# Patient Record
Sex: Female | Born: 1970
Health system: Southern US, Community
[De-identification: ages and names within clinical notes are randomized; demographics above are authoritative.]

## PROBLEM LIST (undated history)

## (undated) DIAGNOSIS — D649 Anemia, unspecified: Secondary | ICD-10-CM

## (undated) DIAGNOSIS — J45909 Unspecified asthma, uncomplicated: Secondary | ICD-10-CM

## (undated) DIAGNOSIS — T7840XA Allergy, unspecified, initial encounter: Secondary | ICD-10-CM

## (undated) DIAGNOSIS — K219 Gastro-esophageal reflux disease without esophagitis: Secondary | ICD-10-CM

## (undated) DIAGNOSIS — G43909 Migraine, unspecified, not intractable, without status migrainosus: Secondary | ICD-10-CM

## (undated) HISTORY — DX: Anemia, unspecified: D64.9

## (undated) HISTORY — DX: Unspecified asthma, uncomplicated: J45.909

## (undated) HISTORY — DX: Allergy, unspecified, initial encounter: T78.40XA

## (undated) HISTORY — PX: OTHER SURGICAL HISTORY: SHX169

---

## 2000-04-23 ENCOUNTER — Other Ambulatory Visit: Admission: RE | Admit: 2000-04-23 | Discharge: 2000-04-23 | Payer: Self-pay | Admitting: Gynecology

## 2000-09-17 ENCOUNTER — Inpatient Hospital Stay (HOSPITAL_COMMUNITY): Admission: AD | Admit: 2000-09-17 | Discharge: 2000-09-20 | Payer: Self-pay | Admitting: Gynecology

## 2000-10-30 ENCOUNTER — Other Ambulatory Visit: Admission: RE | Admit: 2000-10-30 | Discharge: 2000-10-30 | Payer: Self-pay | Admitting: Gynecology

## 2011-02-09 ENCOUNTER — Other Ambulatory Visit (HOSPITAL_COMMUNITY)
Admission: RE | Admit: 2011-02-09 | Discharge: 2011-02-09 | Disposition: A | Discharge status: Home / Self Care | Payer: Managed Care, Other (non HMO) | Source: Ambulatory Visit | Attending: Family Medicine | Admitting: Family Medicine

## 2011-02-09 ENCOUNTER — Other Ambulatory Visit: Payer: Self-pay | Admitting: Family Medicine

## 2011-02-09 DIAGNOSIS — Z124 Encounter for screening for malignant neoplasm of cervix: Secondary | ICD-10-CM | POA: Insufficient documentation

## 2011-03-14 ENCOUNTER — Other Ambulatory Visit: Payer: Self-pay | Admitting: Orthopedic Surgery

## 2011-03-14 DIAGNOSIS — R52 Pain, unspecified: Secondary | ICD-10-CM

## 2011-03-20 ENCOUNTER — Other Ambulatory Visit: Payer: Managed Care, Other (non HMO)

## 2011-03-21 ENCOUNTER — Ambulatory Visit
Admission: RE | Admit: 2011-03-21 | Discharge: 2011-03-21 | Disposition: A | Discharge status: Home / Self Care | Payer: Managed Care, Other (non HMO) | Source: Ambulatory Visit | Attending: Orthopedic Surgery | Admitting: Orthopedic Surgery

## 2011-03-21 DIAGNOSIS — R52 Pain, unspecified: Secondary | ICD-10-CM

## 2011-06-01 ENCOUNTER — Ambulatory Visit: Payer: Self-pay | Attending: Orthopedic Surgery | Admitting: Rehabilitation

## 2011-06-01 DIAGNOSIS — R262 Difficulty in walking, not elsewhere classified: Secondary | ICD-10-CM | POA: Insufficient documentation

## 2011-06-01 DIAGNOSIS — M2560 Stiffness of unspecified joint, not elsewhere classified: Secondary | ICD-10-CM | POA: Insufficient documentation

## 2011-06-01 DIAGNOSIS — M2550 Pain in unspecified joint: Secondary | ICD-10-CM | POA: Insufficient documentation

## 2011-06-01 DIAGNOSIS — IMO0001 Reserved for inherently not codable concepts without codable children: Secondary | ICD-10-CM | POA: Insufficient documentation

## 2011-06-06 ENCOUNTER — Ambulatory Visit: Payer: Self-pay | Admitting: Physical Therapy

## 2011-06-08 ENCOUNTER — Ambulatory Visit: Payer: Self-pay | Admitting: Physical Therapy

## 2011-06-14 ENCOUNTER — Ambulatory Visit: Payer: Self-pay | Admitting: Rehabilitation

## 2011-06-16 ENCOUNTER — Ambulatory Visit: Payer: Self-pay | Admitting: Physical Therapy

## 2011-06-19 ENCOUNTER — Ambulatory Visit: Payer: Self-pay | Admitting: Physical Therapy

## 2011-06-20 ENCOUNTER — Ambulatory Visit: Payer: Self-pay | Admitting: Physical Therapy

## 2011-06-22 ENCOUNTER — Ambulatory Visit: Payer: Self-pay | Attending: Orthopedic Surgery | Admitting: Physical Therapy

## 2011-06-22 DIAGNOSIS — M2550 Pain in unspecified joint: Secondary | ICD-10-CM | POA: Insufficient documentation

## 2011-06-22 DIAGNOSIS — M2560 Stiffness of unspecified joint, not elsewhere classified: Secondary | ICD-10-CM | POA: Insufficient documentation

## 2011-06-22 DIAGNOSIS — IMO0001 Reserved for inherently not codable concepts without codable children: Secondary | ICD-10-CM | POA: Insufficient documentation

## 2011-06-22 DIAGNOSIS — R262 Difficulty in walking, not elsewhere classified: Secondary | ICD-10-CM | POA: Insufficient documentation

## 2011-06-27 ENCOUNTER — Encounter: Payer: Managed Care, Other (non HMO) | Admitting: Physical Therapy

## 2011-06-27 ENCOUNTER — Ambulatory Visit: Payer: Self-pay | Admitting: Physical Therapy

## 2011-06-29 ENCOUNTER — Ambulatory Visit: Payer: Self-pay | Admitting: Physical Therapy

## 2011-07-06 ENCOUNTER — Ambulatory Visit: Payer: Self-pay | Admitting: Physical Therapy

## 2011-07-07 ENCOUNTER — Ambulatory Visit: Payer: Self-pay | Admitting: Physical Therapy

## 2011-07-11 ENCOUNTER — Encounter: Payer: Managed Care, Other (non HMO) | Admitting: Physical Therapy

## 2011-07-13 ENCOUNTER — Ambulatory Visit: Payer: Self-pay | Admitting: Physical Therapy

## 2011-07-20 ENCOUNTER — Encounter: Payer: Managed Care, Other (non HMO) | Admitting: Physical Therapy

## 2011-08-03 ENCOUNTER — Encounter: Payer: Managed Care, Other (non HMO) | Admitting: Physical Therapy

## 2012-08-19 ENCOUNTER — Other Ambulatory Visit: Payer: Self-pay | Admitting: Family Medicine

## 2012-08-19 DIAGNOSIS — R109 Unspecified abdominal pain: Secondary | ICD-10-CM

## 2012-08-20 ENCOUNTER — Ambulatory Visit
Admission: RE | Admit: 2012-08-20 | Discharge: 2012-08-20 | Disposition: A | Discharge status: Home / Self Care | Payer: 59 | Source: Ambulatory Visit | Attending: Family Medicine | Admitting: Family Medicine

## 2012-08-20 DIAGNOSIS — R109 Unspecified abdominal pain: Secondary | ICD-10-CM

## 2013-10-04 ENCOUNTER — Encounter (HOSPITAL_BASED_OUTPATIENT_CLINIC_OR_DEPARTMENT_OTHER): Payer: Self-pay | Admitting: Emergency Medicine

## 2013-10-04 ENCOUNTER — Emergency Department (HOSPITAL_BASED_OUTPATIENT_CLINIC_OR_DEPARTMENT_OTHER): Payer: 59

## 2013-10-04 ENCOUNTER — Emergency Department (HOSPITAL_BASED_OUTPATIENT_CLINIC_OR_DEPARTMENT_OTHER)
Admission: EM | Admit: 2013-10-04 | Discharge: 2013-10-04 | Disposition: A | Discharge status: Home / Self Care | Payer: 59 | Attending: Emergency Medicine | Admitting: Emergency Medicine

## 2013-10-04 DIAGNOSIS — Z880 Allergy status to penicillin: Secondary | ICD-10-CM | POA: Insufficient documentation

## 2013-10-04 DIAGNOSIS — K219 Gastro-esophageal reflux disease without esophagitis: Secondary | ICD-10-CM | POA: Insufficient documentation

## 2013-10-04 DIAGNOSIS — K2970 Gastritis, unspecified, without bleeding: Secondary | ICD-10-CM

## 2013-10-04 DIAGNOSIS — K5900 Constipation, unspecified: Secondary | ICD-10-CM | POA: Insufficient documentation

## 2013-10-04 DIAGNOSIS — Z3202 Encounter for pregnancy test, result negative: Secondary | ICD-10-CM | POA: Insufficient documentation

## 2013-10-04 HISTORY — DX: Gastro-esophageal reflux disease without esophagitis: K21.9

## 2013-10-04 LAB — COMPREHENSIVE METABOLIC PANEL
AST: 20 U/L (ref 0–37)
Albumin: 3.7 g/dL (ref 3.5–5.2)
BUN: 12 mg/dL (ref 6–23)
CO2: 25 mEq/L (ref 19–32)
Calcium: 9 mg/dL (ref 8.4–10.5)
Chloride: 101 mEq/L (ref 96–112)
Creatinine, Ser: 0.5 mg/dL (ref 0.50–1.10)
GFR calc Af Amer: >90 mL/min (ref >90)
GFR calc non Af Amer: >90 mL/min (ref >90)
Glucose, Bld: 91 mg/dL (ref 70–99)
Total Bilirubin: 0.5 mg/dL (ref 0.3–1.2)

## 2013-10-04 LAB — URINALYSIS, ROUTINE W REFLEX MICROSCOPIC
Ketones, ur: NEGATIVE mg/dL
Nitrite: NEGATIVE
Specific Gravity, Urine: 1.024 (ref 1.005–1.030)
pH: 6 (ref 5.0–8.0)

## 2013-10-04 LAB — URINE MICROSCOPIC-ADD ON

## 2013-10-04 LAB — PREGNANCY, URINE: Preg Test, Ur: NEGATIVE

## 2013-10-04 LAB — LIPASE, BLOOD: Lipase: 33 U/L (ref 11–59)

## 2013-10-04 MED ORDER — IOHEXOL 300 MG/ML  SOLN
100.0000 mL | Freq: Once | INTRAMUSCULAR | Status: AC | PRN
  Administered 2013-10-04: 100 mL via INTRAVENOUS

## 2013-10-04 MED ORDER — ONDANSETRON HCL 4 MG/2ML IJ SOLN
4.0000 mg | Freq: Once | INTRAMUSCULAR | Status: AC
  Administered 2013-10-04: 4 mg via INTRAVENOUS
  Filled 2013-10-04: qty 2

## 2013-10-04 MED ORDER — HYDROMORPHONE HCL PF 1 MG/ML IJ SOLN
1.0000 mg | Freq: Once | INTRAMUSCULAR | Status: AC
  Administered 2013-10-04: 1 mg via INTRAVENOUS
  Filled 2013-10-04: qty 1

## 2013-10-04 MED ORDER — IOHEXOL 300 MG/ML  SOLN
50.0000 mL | Freq: Once | INTRAMUSCULAR | Status: AC | PRN
  Administered 2013-10-04: 50 mL via ORAL

## 2013-10-04 MED ORDER — OMEPRAZOLE 20 MG PO CPDR: 20.0000 mg | DELAYED_RELEASE_CAPSULE | Freq: Two times a day (BID) | ORAL | Status: DC

## 2013-10-04 MED ORDER — SODIUM CHLORIDE 0.9 % IV BOLUS (SEPSIS)
1000.0000 mL | Freq: Once | INTRAVENOUS | Status: AC
  Administered 2013-10-04: 1000 mL via INTRAVENOUS

## 2013-10-04 MED ORDER — PANTOPRAZOLE SODIUM 40 MG IV SOLR
40.0000 mg | Freq: Once | INTRAVENOUS | Status: AC
  Administered 2013-10-04: 40 mg via INTRAVENOUS
  Filled 2013-10-04: qty 40

## 2013-10-04 NOTE — ED Notes (Signed)
Patient here with LUQ pain/epigastric pain x 2 week. Here from [facility] for possible CT. Nausea with some vomiting. Had elevated WBC and blood in urine at [facility]

## 2013-10-04 NOTE — ED Provider Notes (Signed)
CSN: [id number]     Arrival date & time 10/04/13  1348 History  This chart was scribed for Melanie Quarry, MD by Clydene Laming, ED Scribe. This patient was seen in room MH10/MH10 and the patient's care was started at 3:38 PM.   Chief Complaint  Patient presents with  . Abdominal Pain    HPI HPI Comments: Melanie Herandez is a 42 y.o. female who presents to the Emergency Department complaining of left upper quadrant abd/epigastric pain onset two weeks ago with associated nausea, vomiting, and constipation. Pt reports to the ED from [facility]. Pt has been experiencing intermittent abd pain for the past year and a half but has been worst the last two weeks. Since yesterday pt has experienced 3-4 episodes an hour of sharp, burning, dull pain rated as 7/10 currently. Pt denies radiation to other regions. Pt has vomited last night and this morning. Pt states that she has not felt like eating because it is causing her to feel extremely full and bloated. Her last meal was this morning before work. Pt had an ultrasound one year ago done by [employer] [facility] on [address]. Pt was taking omeprazole which caused acid reflux disease. Pt is not using any other medications. Pt is allergic to penicillin. Pt denies smoking or drinking. Pt reports having normal periods and her last cycle was two weeks ago. Pt is not on birth control. Pt denies uti or vaginal discharge.    Past Medical History  Diagnosis Date  . GERD (gastroesophageal reflux disease)    History reviewed. No pertinent past surgical history. No family history on file. History  Substance Use Topics  . Smoking status: Never Smoker   . Smokeless tobacco: Not on file  . Alcohol Use: Not on file   OB History   Grav Para Term Preterm Abortions TAB SAB Ect Mult Living                 Review of Systems  Constitutional: Negative for fever.  Gastrointestinal: Positive for nausea, vomiting, abdominal pain and constipation.  Genitourinary:  Negative for vaginal discharge.    Allergies  Penicillins  Home Medications   Current Outpatient Rx  Name  Route  Sig  Dispense  Refill  . omeprazole (PRILOSEC) 20 MG capsule   Oral   Take 20 mg by mouth daily.          Triage Vitals: Pulse:71  Temp(Src) 98.7 F (37.1 C) (Oral)  Resp 16  Ht 5\' 3"  (1.6 m)  Wt 157 lb (71.215 kg)  BMI 27.82 kg/m2  SpO2 100%  LMP 09/20/2013 Physical Exam  Nursing note and vitals reviewed. Constitutional: She is oriented to person, place, and time. She appears well-developed and well-nourished.  HENT:  Head: Normocephalic and atraumatic.  Right Ear: External ear normal.  Left Ear: External ear normal.  Nose: Nose normal.  Mouth/Throat: Oropharynx is clear and moist.  Eyes: Conjunctivae and EOM are normal. Pupils are equal, round, and reactive to light.  Neck: Normal range of motion. Neck supple. No JVD present. No tracheal deviation present. No thyromegaly present.  Cardiovascular: Normal rate, regular rhythm, normal heart sounds and intact distal pulses.   Pulmonary/Chest: Effort normal and breath sounds normal. No respiratory distress. She has no wheezes.  Abdominal: Soft. Bowel sounds are normal. She exhibits no mass. There is tenderness. There is no guarding.  Tenderness of the ruq and epigastric region   Musculoskeletal: Normal range of motion.  Lymphadenopathy:    She  has no cervical adenopathy.  Neurological: She is alert and oriented to person, place, and time. She has normal reflexes. No cranial nerve deficit or sensory deficit. Gait normal. GCS eye subscore is 4. GCS verbal subscore is 5. GCS motor subscore is 6.  Reflex Scores:      Bicep reflexes are 2+ on the right side and 2+ on the left side.      Patellar reflexes are 2+ on the right side and 2+ on the left side. Strength is 5/5 bilateral elbow flexor/extensors, wrist extension/flexion, intrinsic hand strength equal Bilateral hip flexion/extension 5/5, knee  flexion/extension 5/5, ankle 5/5 flexion extension    Skin: Skin is warm and dry.  Psychiatric: She has a normal mood and affect. Her behavior is normal. Judgment and thought content normal.    ED Course  Procedures (including critical care time) DIAGNOSTIC STUDIES: Oxygen Saturation is 100% on RA, normal by my interpretation.    COORDINATION OF CARE: 3:48 PM- Discussed treatment plan with pt at bedside. Pt verbalized understanding and agreement with plan.   Labs Review Labs Reviewed  URINALYSIS, ROUTINE W REFLEX MICROSCOPIC - Abnormal; Notable for the following:    APPearance CLOUDY (*)    Hgb urine dipstick MODERATE (*)    Leukocytes, UA TRACE (*)    All other components within normal limits  URINE MICROSCOPIC-ADD ON - Abnormal; Notable for the following:    Squamous Epithelial / LPF FEW (*)    Bacteria, UA FEW (*)    All other components within normal limits  PREGNANCY, URINE   Imaging Review No results found.  EKG Interpretation   None       MDM  No diagnosis found.  42 y.o,. Female with epigastric pain increased with po intake.  She has report of negative [place] one year ago.  Today ct with gastritis and possible gastric ulcer.   Discussed with Dr. Bosie Clos and plan ppi bid and she is to call for follow up on Monday.    Discussed with patient and voices understanding.      Melanie Quarry, MD 10/04/13 [phone]

## 2013-10-04 NOTE — ED Notes (Signed)
Patient transported to CT. Pt door closed.

## 2013-10-06 NOTE — ED Notes (Signed)
Chart review for MD office, referd to,[facility] for further information

## 2016-02-21 DIAGNOSIS — Z124 Encounter for screening for malignant neoplasm of cervix: Secondary | ICD-10-CM | POA: Insufficient documentation

## 2016-02-21 DIAGNOSIS — Z1151 Encounter for screening for human papillomavirus (HPV): Secondary | ICD-10-CM | POA: Insufficient documentation

## 2016-02-21 DIAGNOSIS — R102 Pelvic and perineal pain: Secondary | ICD-10-CM | POA: Insufficient documentation

## 2017-07-13 ENCOUNTER — Encounter (HOSPITAL_COMMUNITY): Payer: Self-pay | Admitting: *Deleted

## 2017-07-13 ENCOUNTER — Observation Stay (HOSPITAL_COMMUNITY)
Admission: EM | Admit: 2017-07-13 | Discharge: 2017-07-14 | Disposition: A | Discharge status: Home / Self Care | Payer: 59 | Attending: Internal Medicine | Admitting: Internal Medicine

## 2017-07-13 DIAGNOSIS — E860 Dehydration: Secondary | ICD-10-CM

## 2017-07-13 DIAGNOSIS — A419 Sepsis, unspecified organism: Principal | ICD-10-CM | POA: Insufficient documentation

## 2017-07-13 DIAGNOSIS — K219 Gastro-esophageal reflux disease without esophagitis: Secondary | ICD-10-CM | POA: Diagnosis not present

## 2017-07-13 DIAGNOSIS — R55 Syncope and collapse: Secondary | ICD-10-CM | POA: Diagnosis not present

## 2017-07-13 DIAGNOSIS — R6510 Systemic inflammatory response syndrome (SIRS) of non-infectious origin without acute organ dysfunction: Secondary | ICD-10-CM | POA: Diagnosis present

## 2017-07-13 DIAGNOSIS — Z880 Allergy status to penicillin: Secondary | ICD-10-CM | POA: Insufficient documentation

## 2017-07-13 DIAGNOSIS — Z79899 Other long term (current) drug therapy: Secondary | ICD-10-CM | POA: Insufficient documentation

## 2017-07-13 DIAGNOSIS — B349 Viral infection, unspecified: Secondary | ICD-10-CM

## 2017-07-13 DIAGNOSIS — D72829 Elevated white blood cell count, unspecified: Secondary | ICD-10-CM

## 2017-07-13 LAB — CBC WITH DIFFERENTIAL/PLATELET
BASOS ABS: 0 10*3/uL (ref 0.0–0.1)
BASOS PCT: 0 %
EOS ABS: 0.1 10*3/uL (ref 0.0–0.7)
Eosinophils Relative: 0 %
HEMATOCRIT: 41.9 % (ref 36.0–46.0)
HEMOGLOBIN: 14.1 g/dL (ref 12.0–15.0)
Lymphocytes Relative: 11 %
Lymphs Abs: 2.3 10*3/uL (ref 0.7–4.0)
MCH: 27.2 pg (ref 26.0–34.0)
MCHC: 33.7 g/dL (ref 30.0–36.0)
MCV: 80.9 fL (ref 78.0–100.0)
Monocytes Absolute: 0.7 10*3/uL (ref 0.1–1.0)
Monocytes Relative: 4 %
NEUTROS ABS: 18 10*3/uL — AB (ref 1.7–7.7)
NEUTROS PCT: 85 %
Platelets: 348 10*3/uL (ref 150–400)
RBC: 5.18 MIL/uL — ABNORMAL HIGH (ref 3.87–5.11)
RDW: 13.7 % (ref 11.5–15.5)
WBC: 21.2 10*3/uL — ABNORMAL HIGH (ref 4.0–10.5)

## 2017-07-13 LAB — URINALYSIS, ROUTINE W REFLEX MICROSCOPIC
Bilirubin Urine: NEGATIVE
Glucose, UA: NEGATIVE mg/dL
Ketones, ur: NEGATIVE mg/dL
Leukocytes, UA: NEGATIVE
Nitrite: NEGATIVE
Protein, ur: NEGATIVE mg/dL
Specific Gravity, Urine: 1.02 (ref 1.005–1.030)
pH: 6 (ref 5.0–8.0)

## 2017-07-13 LAB — CBC
HCT: 44.9 % (ref 36.0–46.0)
Hemoglobin: 15 g/dL (ref 12.0–15.0)
MCH: 27.3 pg (ref 26.0–34.0)
MCHC: 33.4 g/dL (ref 30.0–36.0)
MCV: 81.6 fL (ref 78.0–100.0)
PLATELETS: 365 10*3/uL (ref 150–400)
RBC: 5.5 MIL/uL — ABNORMAL HIGH (ref 3.87–5.11)
RDW: 13.8 % (ref 11.5–15.5)
WBC: 23.8 10*3/uL — ABNORMAL HIGH (ref 4.0–10.5)

## 2017-07-13 LAB — CBG MONITORING, ED: GLUCOSE-CAPILLARY: 138 mg/dL — AB (ref 65–99)

## 2017-07-13 LAB — BASIC METABOLIC PANEL
Anion gap: 8 (ref 5–15)
BUN: 12 mg/dL (ref 6–20)
CALCIUM: 7.3 mg/dL — AB (ref 8.9–10.3)
CHLORIDE: 111 mmol/L (ref 101–111)
CO2: 20 mmol/L — AB (ref 22–32)
CREATININE: 0.61 mg/dL (ref 0.44–1.00)
GFR calc Af Amer: >60 mL/min (ref >60)
GFR calc non Af Amer: >60 mL/min (ref >60)
Glucose, Bld: 129 mg/dL — ABNORMAL HIGH (ref 65–99)
Potassium: 3.4 mmol/L — ABNORMAL LOW (ref 3.5–5.1)
Sodium: 139 mmol/L (ref 135–145)

## 2017-07-13 LAB — RAPID STREP SCREEN (MED CTR MEBANE ONLY): STREPTOCOCCUS, GROUP A SCREEN (DIRECT): NEGATIVE

## 2017-07-13 LAB — I-STAT CG4 LACTIC ACID, ED: LACTIC ACID, VENOUS: 2.85 mmol/L — AB (ref 0.5–1.9)

## 2017-07-13 LAB — LACTIC ACID, PLASMA
LACTIC ACID, VENOUS: 1.2 mmol/L (ref 0.5–1.9)
LACTIC ACID, VENOUS: 1.7 mmol/L (ref 0.5–1.9)

## 2017-07-13 MED ORDER — SODIUM CHLORIDE 0.9 % IV BOLUS (SEPSIS)
2000.0000 mL | Freq: Once | INTRAVENOUS | Status: AC
  Administered 2017-07-13: 2000 mL via INTRAVENOUS

## 2017-07-13 MED ORDER — SODIUM CHLORIDE 0.9 % IV SOLN
INTRAVENOUS | Status: DC
  Administered 2017-07-13: 16:00:00 via INTRAVENOUS

## 2017-07-13 MED ORDER — ENOXAPARIN SODIUM 40 MG/0.4ML ~~LOC~~ SOLN: 40.0000 mg | SUBCUTANEOUS | Status: DC

## 2017-07-13 MED ORDER — CEFTRIAXONE SODIUM 1 G IJ SOLR
1.0000 g | INTRAMUSCULAR | Status: DC
  Administered 2017-07-13: 1 g via INTRAVENOUS
  Filled 2017-07-13 (×2): qty 10

## 2017-07-13 MED ORDER — POTASSIUM CHLORIDE IN NACL 20-0.9 MEQ/L-% IV SOLN
INTRAVENOUS | Status: DC
  Administered 2017-07-13 – 2017-07-14 (×2): via INTRAVENOUS
  Filled 2017-07-13 (×4): qty 1000

## 2017-07-13 NOTE — ED Provider Notes (Signed)
[facility] DEPT Provider Note   CSN: [id number] Arrival date & time: 07/13/17  1341     History   Chief Complaint Chief Complaint  Patient presents with  . Dizziness    HPI Melanie Frazier is a 46 y.o. female.  46 year old female presents with acute onset of become dizzy and lightheaded just prior to arrival. She also became diaphoretic. Denied any chest pain or palpitations. No recent illnesses. Denies any recent heavy periods or bloody stools. No new medications. Does have a history of anemia. Begin hyperventilating and started to have bilateral hand and foot pain. EMS called and patient found hypotensive given IV fluids and transported here.      Past Medical History:  Diagnosis Date  . GERD (gastroesophageal reflux disease)     There are no active problems to display for this patient.   History reviewed. No pertinent surgical history.  OB History    No data available       Home Medications    Prior to Admission medications   Medication Sig Start Date End Date Taking? Authorizing Provider  pseudoephedrine (SINUS & ALLERGY 12 HOUR) 120 MG 12 hr tablet Take 120 mg by mouth every 12 (twelve) hours as needed for congestion.   Yes [provider]  omeprazole (PRILOSEC) 20 MG capsule Take 1 capsule (20 mg total) by mouth 2 (two) times daily. Patient not taking: Reported on 07/13/2017 10/04/13   Pattricia Boss, MD    Family History No family history on file.  Social History Social History  Substance Use Topics  . Smoking status: Never Smoker  . Smokeless tobacco: Not on file  . Alcohol use Not on file     Allergies   Penicillins   Review of Systems Review of Systems  All other systems reviewed and are negative.    Physical Exam Updated Vital Signs BP (!) 91/44   Pulse 63   Temp 98.9 F (37.2 C) (Oral)   Resp 18   LMP 07/06/2017   SpO2 100%   Physical Exam  Constitutional: She is oriented to person, place, and time. She appears  well-developed and well-nourished.  Non-toxic appearance. No distress.  HENT:  Head: Normocephalic and atraumatic.  Eyes: Pupils are equal, round, and reactive to light. Conjunctivae, EOM and lids are normal.  Neck: Normal range of motion. Neck supple. No tracheal deviation present. No thyroid mass present.  Cardiovascular: Normal rate, regular rhythm and normal heart sounds.  Exam reveals no gallop.   No murmur heard. Pulmonary/Chest: Effort normal and breath sounds normal. No stridor. No respiratory distress. She has no decreased breath sounds. She has no wheezes. She has no rhonchi. She has no rales.  Abdominal: Soft. Normal appearance and bowel sounds are normal. She exhibits no distension. There is no tenderness. There is no rebound and no CVA tenderness.  Musculoskeletal: Normal range of motion. She exhibits no edema or tenderness.  Neurological: She is alert and oriented to person, place, and time. She has normal strength. No cranial nerve deficit or sensory deficit. GCS eye subscore is 4. GCS verbal subscore is 5. GCS motor subscore is 6.  Skin: Skin is warm and dry. No abrasion and no rash noted.  Psychiatric: She has a normal mood and affect. Her speech is normal and behavior is normal.  Nursing note and vitals reviewed.    ED Treatments / Results  Labs (all labs ordered are listed, but only abnormal results are displayed) Labs Reviewed  CBG MONITORING, ED - Abnormal;  Notable for the following:       Result Value   Glucose-Capillary 138 (*)    All other components within normal limits  BASIC METABOLIC PANEL  CBC  URINALYSIS, ROUTINE W REFLEX MICROSCOPIC    EKG  EKG Interpretation  Date/Time:  Friday July 13 2017 13:54:17 EDT Ventricular Rate:  69 PR Interval:    QRS Duration: 99 QT Interval:  424 QTC Calculation: 455 R Axis:   83 Text Interpretation:  Sinus rhythm Borderline ST elevation, anterolateral leads Confirmed by Lacretia Leigh ([id number]) on 07/13/2017 2:16:03  PM       Radiology No results found.  Procedures Procedures (including critical care time)  Medications Ordered in ED Medications  sodium chloride 0.9 % bolus 2,000 mL (not administered)  0.9 %  sodium chloride infusion (not administered)     Initial Impression / Assessment and Plan / ED Course  I have reviewed the triage vital signs and the nursing notes.  Pertinent labs & imaging results that were available during my care of the patient were reviewed by me and considered in my medical decision making (see chart for details).     Patient responded well to IV fluids here. Blood pressure has improved. Significant leukocytosis noted and does have elevated lactate. She is afebrile here. Will repeat CBC. Discussed with hospitalist will come and see  Final Clinical Impressions(s) / ED Diagnoses   Final diagnoses:  None    New Prescriptions New Prescriptions   No medications on file     Lacretia Leigh, MD 07/13/17 1555

## 2017-07-13 NOTE — ED Triage Notes (Signed)
Per EMS, pt from work complains of dizziness, diaphoresis since 12PM today. Pt's BP was 80/50 upon EMS arrival. Pt has hx of anemia. Pt also has bilateral hand pain/tingling. Pt denies fall or loss of consciousness.   CBG 133 BP ranged from 80/50-100/60

## 2017-07-13 NOTE — ED Notes (Signed)
Bed: MW41 Expected date:  Expected time:  Means of arrival:  Comments: Dizziness, hypotension

## 2017-07-13 NOTE — ED Notes (Signed)
ED TO INPATIENT HANDOFF REPORT  Name/Age/Gender Melanie Frazier 46 y.o. female  Code Status    Code Status Orders        Start     Ordered   07/13/17 1605  Full code  Continuous     07/13/17 1605    Code Status History    Date Active Date Inactive Code Status Order ID Comments User Context   This patient has a current code status but no historical code status.      Home/SNF/Other Home  Chief Complaint Dizziness  Level of Care/Admitting Diagnosis ED Disposition    ED Disposition Condition [facility] Area: [facility] [[id number]]  Level of Care: Med-Surg [16]  Diagnosis: Sepsis [facility]) [[id number]]  Admitting Physician: Georgette Shell [[id number]]  Attending Physician: Georgette Shell [[id number]]  Estimated length of stay: 3 - 4 days  Certification:: I certify this patient will need inpatient services for at least 2 midnights  PT Class (Do Not Modify): Inpatient [101]  PT Acc Code (Do Not Modify): Private [1]       Medical History Past Medical History:  Diagnosis Date  . GERD (gastroesophageal reflux disease)     Allergies Allergies  Allergen Reactions  . Penicillins Rash    Has patient had a PCN reaction causing immediate rash, facial/tongue/throat swelling, SOB or lightheadedness with hypotension: n/a Has patient had a PCN reaction causing severe rash involving mucus membranes or skin necrosis: n/a Has patient had a PCN reaction that required hospitalization: n/a Has patient had a PCN reaction occurring within the last 10 years: n/a If all of the above answers are "NO", then may proceed with Cephalosporin use     IV Location/Drains/Wounds Patient Lines/Drains/Airways Status   Active Line/Drains/Airways    Name:   Placement date:   Placement time:   Site:   Days:   Peripheral IV 07/13/17 Left Forearm  07/13/17    1401    Forearm    less than 1   Peripheral IV 07/13/17 Right Forearm  07/13/17    1615    Forearm    less  than 1          Labs/Imaging Results for orders placed or performed during the hospital encounter of 07/13/17 (from the past 48 hour(s))  Basic metabolic panel     Status: Abnormal   Collection Time: 07/13/17  1:57 PM  Result Value Ref Range   Sodium 139 135 - 145 mmol/L   Potassium 3.4 (L) 3.5 - 5.1 mmol/L   Chloride 111 101 - 111 mmol/L   CO2 20 (L) 22 - 32 mmol/L   Glucose, Bld 129 (H) 65 - 99 mg/dL   BUN 12 6 - 20 mg/dL   Creatinine, Ser 0.61 0.44 - 1.00 mg/dL   Calcium 7.3 (L) 8.9 - 10.3 mg/dL   GFR calc non Af Amer >60 >60 mL/min   GFR calc Af Amer >60 >60 mL/min    Comment: (NOTE) The eGFR has been calculated using the CKD EPI equation. This calculation has not been validated in all clinical situations. eGFR's persistently <60 mL/min signify possible Chronic Kidney Disease.    Anion gap 8 5 - 15  CBC     Status: Abnormal   Collection Time: 07/13/17  1:57 PM  Result Value Ref Range   WBC 23.8 (H) 4.0 - 10.5 K/uL   RBC 5.50 (H) 3.87 - 5.11 MIL/uL   Hemoglobin 15.0 12.0 - 15.0 g/dL  HCT 44.9 36.0 - 46.0 %   MCV 81.6 78.0 - 100.0 fL   MCH 27.3 26.0 - 34.0 pg   MCHC 33.4 30.0 - 36.0 g/dL   RDW 13.8 11.5 - 15.5 %   Platelets 365 150 - 400 K/uL  Urinalysis, Routine w reflex microscopic     Status: Abnormal   Collection Time: 07/13/17  1:57 PM  Result Value Ref Range   Color, Urine YELLOW YELLOW   APPearance HAZY (A) CLEAR   Specific Gravity, Urine 1.020 1.005 - 1.030   pH 6.0 5.0 - 8.0   Glucose, UA NEGATIVE NEGATIVE mg/dL   Hgb urine dipstick SMALL (A) NEGATIVE   Bilirubin Urine NEGATIVE NEGATIVE   Ketones, ur NEGATIVE NEGATIVE mg/dL   Protein, ur NEGATIVE NEGATIVE mg/dL   Nitrite NEGATIVE NEGATIVE   Leukocytes, UA NEGATIVE NEGATIVE   RBC / HPF 0-5 0 - 5 RBC/hpf   WBC, UA 6-30 0 - 5 WBC/hpf   Bacteria, UA RARE (A) NONE SEEN   Squamous Epithelial / LPF 0-5 (A) NONE SEEN   Mucus PRESENT    Hyaline Casts, UA PRESENT   CBG monitoring, ED     Status: Abnormal    Collection Time: 07/13/17  2:00 PM  Result Value Ref Range   Glucose-Capillary 138 (H) 65 - 99 mg/dL  I-Stat CG4 Lactic Acid, ED     Status: Abnormal   Collection Time: 07/13/17  3:43 PM  Result Value Ref Range   Lactic Acid, Venous 2.85 (HH) 0.5 - 1.9 mmol/L   Comment NOTIFIED PHYSICIAN   CBC with Differential/Platelet     Status: Abnormal   Collection Time: 07/13/17  4:41 PM  Result Value Ref Range   WBC 21.2 (H) 4.0 - 10.5 K/uL   RBC 5.18 (H) 3.87 - 5.11 MIL/uL   Hemoglobin 14.1 12.0 - 15.0 g/dL   HCT 41.9 36.0 - 46.0 %   MCV 80.9 78.0 - 100.0 fL   MCH 27.2 26.0 - 34.0 pg   MCHC 33.7 30.0 - 36.0 g/dL   RDW 13.7 11.5 - 15.5 %   Platelets 348 150 - 400 K/uL   Neutrophils Relative % 85 %   Neutro Abs 18.0 (H) 1.7 - 7.7 K/uL   Lymphocytes Relative 11 %   Lymphs Abs 2.3 0.7 - 4.0 K/uL   Monocytes Relative 4 %   Monocytes Absolute 0.7 0.1 - 1.0 K/uL   Eosinophils Relative 0 %   Eosinophils Absolute 0.1 0.0 - 0.7 K/uL   Basophils Relative 0 %   Basophils Absolute 0.0 0.0 - 0.1 K/uL   No results found.  Pending Labs [facility]     Ordered   07/20/17 0500  Creatinine, serum  (enoxaparin (LOVENOX)    CrCl >/= 30 ml/min)  Weekly,   R    Comments:  while on enoxaparin therapy    07/13/17 1605   07/14/17 5176  Basic metabolic panel  Tomorrow morning,   R     07/13/17 1605   07/14/17 0500  CBC  Tomorrow morning,   R     07/13/17 1605   07/13/17 1603  HIV antibody (Routine Testing)  Once,   R     07/13/17 1605   07/13/17 1603  CBC  (enoxaparin (LOVENOX)    CrCl >/= 30 ml/min)  Once,   R    Comments:  Baseline for enoxaparin therapy IF NOT ALREADY DRAWN.  Notify MD if PLT < 100 K.  07/13/17 1605   07/13/17 1547  Culture, blood (Routine X 2) w Reflex to ID Panel  BLOOD CULTURE X 2,   R     07/13/17 1546      Vitals/Pain Today's Vitals   07/13/17 1400 07/13/17 1500 07/13/17 1530 07/13/17 1600  BP: (!) 94/51 (!) 92/49 105/79 103/63  Pulse: 61 74 80 82   Resp: 18 17 14 15   Temp:      TempSrc:      SpO2: 100% 100% 100% 100%  PainSc:        Isolation Precautions No active isolations  Medications Medications  0.9 %  sodium chloride infusion ( Intravenous New Bag/Given 07/13/17 1629)  enoxaparin (LOVENOX) injection 40 mg (not administered)  sodium chloride 0.9 % bolus 2,000 mL (0 mLs Intravenous Stopped 07/13/17 1615)    Mobility walks

## 2017-07-13 NOTE — H&P (Addendum)
History and Physical    Melanie Frazier ZDG:[id number] DOB: October 25, 1971 DOA: 07/13/2017  PCP: Patient, No Pcp Per  Patient coming from: home    Chief Complaint: dizzy  HPI: Melanie Frazier is a 46 y.o. female admitted with dizziness.patient was at work.she works at [employer] in [employer] were its mainly hot.she denies fever,chills,nausea,vomitting,diarrhea.she denies chest pain shortness of breath.she denies headache,changes with vision. She denies shortness of breath swallowing difficulty,. She is actually hungry and requesting for food. She reports that she was sick a week ago but it lasted only 2 days. She took [employer] and her symptoms got better.  ED Course: received ivf.  Review of Systems: As per HPI otherwise 10 point review of systems negative.    Past Medical History:  Diagnosis Date  . GERD (gastroesophageal reflux disease)     History reviewed. No pertinent surgical history.   reports that she has never smoked. She does not have any smokeless tobacco history on file. Her alcohol and drug histories are not on file.  Allergies  Allergen Reactions  . Penicillins Rash    Has patient had a PCN reaction causing immediate rash, facial/tongue/throat swelling, SOB or lightheadedness with hypotension: n/a Has patient had a PCN reaction causing severe rash involving mucus membranes or skin necrosis: n/a Has patient had a PCN reaction that required hospitalization: n/a Has patient had a PCN reaction occurring within the last 10 years: n/a If all of the above answers are "NO", then may proceed with Cephalosporin use     No family history on file. Unacceptable: Noncontributory, unremarkable, or negative. Acceptable: Family history reviewed and not pertinent (If you reviewed it)  Prior to Admission medications   Medication Sig Start Date End Date Taking? Authorizing Provider  pseudoephedrine (SINUS & ALLERGY 12 HOUR) 120 MG 12 hr tablet Take 120 mg by mouth every 12  (twelve) hours as needed for congestion.   Yes [provider]  omeprazole (PRILOSEC) 20 MG capsule Take 1 capsule (20 mg total) by mouth 2 (two) times daily. Patient not taking: Reported on 07/13/2017 10/04/13   Pattricia Boss, MD    Physical Exam: Vitals:   07/13/17 1400 07/13/17 1500 07/13/17 1530 07/13/17 1600  BP: (!) 94/51 (!) 92/49 105/79 103/63  Pulse: 61 74 80 82  Resp: 18 17 14 15   Temp:      TempSrc:      SpO2: 100% 100% 100% 100%    Constitutional: NAD, calm, comfortable Vitals:   07/13/17 1400 07/13/17 1500 07/13/17 1530 07/13/17 1600  BP: (!) 94/51 (!) 92/49 105/79 103/63  Pulse: 61 74 80 82  Resp: 18 17 14 15   Temp:      TempSrc:      SpO2: 100% 100% 100% 100%   Eyes: PERRL, lids and conjunctivae normal ENMT: Mucous membranes are moist. Posterior pharynx mild erythema..Normal dentition.  Neck: normal, supple, no masses, no thyromegaly Respiratory: clear to auscultation bilaterally, no wheezing, no crackles. Normal respiratory effort. No accessory muscle use.  Cardiovascular: Regular rate and rhythm, no murmurs / rubs / gallops. No extremity edema. 2+ pedal pulses. No carotid bruits.  Abdomen: no tenderness, no masses palpated. No hepatosplenomegaly. Bowel sounds positive.  Musculoskeletal: no clubbing / cyanosis. No joint deformity upper and lower extremities. Good ROM, no contractures. Normal muscle tone.  Skin: no rashes, lesions, ulcers. No induration Neurologic: CN 2-12 grossly intact. Sensation intact, DTR normal. Strength 5/5 in all 4.  Psychiatric: Normal judgment and insight. Alert and oriented x  3. Normal  Labs on Admission: I have personally reviewed following labs and imaging studies  CBC:  Recent Labs Lab 07/13/17 1357 07/13/17 1641  WBC 23.8* 21.2*  NEUTROABS  --  18.0*  HGB 15.0 14.1  HCT 44.9 41.9  MCV 81.6 80.9  PLT 365 470   Basic Metabolic Panel:  Recent Labs Lab 07/13/17 1357  NA 139  K 3.4*  CL 111  CO2 20*    GLUCOSE 129*  BUN 12  CREATININE 0.61  CALCIUM 7.3*   GFR: CrCl cannot be calculated (Unknown ideal weight.). Liver Function Tests: No results for input(s): AST, ALT, ALKPHOS, BILITOT, PROT, ALBUMIN in the last 168 hours. No results for input(s): LIPASE, AMYLASE in the last 168 hours. No results for input(s): AMMONIA in the last 168 hours. Coagulation Profile: No results for input(s): INR, PROTIME in the last 168 hours. Cardiac Enzymes: No results for input(s): CKTOTAL, CKMB, CKMBINDEX, TROPONINI in the last 168 hours. BNP (last 3 results) No results for input(s): PROBNP in the last 8760 hours. HbA1C: No results for input(s): HGBA1C in the last 72 hours. CBG:  Recent Labs Lab 07/13/17 1400  GLUCAP 138*   Lipid Profile: No results for input(s): CHOL, HDL, LDLCALC, TRIG, CHOLHDL, LDLDIRECT in the last 72 hours. Thyroid Function Tests: No results for input(s): TSH, T4TOTAL, FREET4, T3FREE, THYROIDAB in the last 72 hours. Anemia Panel: No results for input(s): VITAMINB12, FOLATE, FERRITIN, TIBC, IRON, RETICCTPCT in the last 72 hours. Urine analysis:    Component Value Date/Time   COLORURINE YELLOW 07/13/2017 1357   APPEARANCEUR HAZY (A) 07/13/2017 1357   LABSPEC 1.020 07/13/2017 1357   PHURINE 6.0 07/13/2017 1357   GLUCOSEU NEGATIVE 07/13/2017 1357   HGBUR SMALL (A) 07/13/2017 1357   BILIRUBINUR NEGATIVE 07/13/2017 1357   [place] 07/13/2017 1357   PROTEINUR NEGATIVE 07/13/2017 1357   UROBILINOGEN 0.2 10/04/2013 1405   NITRITE NEGATIVE 07/13/2017 1357   LEUKOCYTESUR NEGATIVE 07/13/2017 1357    Radiological Exams on Admission: No results found.  EKG: Independently reviewed.   Assessment/Plan Active Problems:   Sepsis ([place]) Leukocytosis with no bandemia patient presented with symptoms of dizziness. The only complaint she had after I saw her was her throat hurts. She also reports that she was sick almost a week ago and she took Alka-Seltzer and her  symptoms got better. And she is allergic to penicillin. Blood cultures have been drawn in the ER will monitor and follow follow-up labs tomorrow morning.patient Started complaining of throat pain after she got to the floor.will start rocephin for possible pharyngitis.  Hypokalemia mild replete potassium.  Mild dehydration after IV hydration her white count came down to 21.2 as well as her hemoglobin and platelets came down a bit after hydration.     DVT prophylaxis:lovenox Code Status: full Family Communication:  Disposition Plan:  Consults called:  Admission status: inpatient   Georgette Shell MD [facility]   If 7PM-7AM, please contact night-coverage [email or web address] Password [facility]  07/13/2017, 5:40 PM

## 2017-07-13 NOTE — ED Notes (Signed)
Lying: HR76, RR17, SpO2 100%, BP 100/45 Sitting: HR88, RR18, SpO2 100%, BP 103/52 Standing: HR90, RR19, SpO2 100%, BP 100/55

## 2017-07-14 DIAGNOSIS — B349 Viral infection, unspecified: Secondary | ICD-10-CM

## 2017-07-14 DIAGNOSIS — R6510 Systemic inflammatory response syndrome (SIRS) of non-infectious origin without acute organ dysfunction: Secondary | ICD-10-CM

## 2017-07-14 DIAGNOSIS — D72829 Elevated white blood cell count, unspecified: Secondary | ICD-10-CM | POA: Diagnosis not present

## 2017-07-14 DIAGNOSIS — E860 Dehydration: Secondary | ICD-10-CM | POA: Diagnosis not present

## 2017-07-14 LAB — CBC
HCT: 37.3 % (ref 36.0–46.0)
HEMOGLOBIN: 12.3 g/dL (ref 12.0–15.0)
MCH: 27 pg (ref 26.0–34.0)
MCHC: 33 g/dL (ref 30.0–36.0)
MCV: 81.8 fL (ref 78.0–100.0)
Platelets: 337 10*3/uL (ref 150–400)
RBC: 4.56 MIL/uL (ref 3.87–5.11)
RDW: 14.1 % (ref 11.5–15.5)
WBC: 12.2 10*3/uL — ABNORMAL HIGH (ref 4.0–10.5)

## 2017-07-14 LAB — BASIC METABOLIC PANEL
ANION GAP: 4 — AB (ref 5–15)
BUN: 7 mg/dL (ref 6–20)
CALCIUM: 7.6 mg/dL — AB (ref 8.9–10.3)
CO2: 23 mmol/L (ref 22–32)
Chloride: 112 mmol/L — ABNORMAL HIGH (ref 101–111)
Creatinine, Ser: 0.51 mg/dL (ref 0.44–1.00)
GFR calc non Af Amer: >60 mL/min (ref >60)
Glucose, Bld: 95 mg/dL (ref 65–99)
POTASSIUM: 3.7 mmol/L (ref 3.5–5.1)
Sodium: 139 mmol/L (ref 135–145)

## 2017-07-14 LAB — HIV ANTIBODY (ROUTINE TESTING W REFLEX): HIV Screen 4th Generation wRfx: NONREACTIVE

## 2017-07-14 MED ORDER — DOXYCYCLINE HYCLATE 100 MG PO CAPS: 100.0000 mg | ORAL_CAPSULE | Freq: Two times a day (BID) | ORAL | 0 refills | Status: DC

## 2017-07-14 NOTE — Care Management Note (Signed)
Case Management Note  Patient Details  Name: Melanie Frazier MRN: [id number] Date of Birth: 27-Jul-1971  Subjective/Objective:                 Patient with order to DC to home today. Chart reviewed. No Home Health or Equipment needs, no unacknowledged Case Management consults or medication needs identified at the time of this note. Plan for DC to home. If needs arise today prior to discharge, please call Carles Collet RN CM at [phone].    Action/Plan:   Expected Discharge Date:  07/14/17               Expected Discharge Plan:  Home/Self Care  In-House Referral:     Discharge planning Services  CM Consult  Post Acute Care Choice:    Choice offered to:     DME Arranged:    DME Agency:     HH Arranged:    HH Agency:     Status of Service:  Completed, signed off  If discussed at [employer] of Stay Meetings, dates discussed:    Additional Comments:  Carles Collet, RN 07/14/2017, 8:51 AM

## 2017-07-14 NOTE — Progress Notes (Signed)
Pts IV removed with a clean and dry dressing intact. Pt denies pain at this time with no s/s of distress noted.

## 2017-07-14 NOTE — Discharge Summary (Signed)
Physician Discharge Summary  Melanie Frazier KVQ:[id number] DOB: 1971-07-18 DOA: 07/13/2017  PCP: Patient, No Pcp Per  Admit date: 07/13/2017 Discharge date: 07/14/2017  Admitted From: home Disposition:  home  Recommendations for Outpatient Follow-up:  1. Follow up with PCP as needed  Home Health: none Equipment/Devices: none  Discharge Condition: stable CODE STATUS: Full code Diet recommendation: regular  HPI: Per Dr. Rodena Piety, Melanie Frazier is a 46 y.o. female admitted with dizziness.patient was at work.she works at [employer] in [employer] were its mainly hot.she denies fever,chills,nausea,vomitting,diarrhea.she denies chest pain shortness of breath.she denies headache,changes with vision. She denies shortness of breath swallowing difficulty,. She is actually hungry and requesting for food. She reports that she was sick a week ago but it lasted only 2 days. She took [employer] and her symptoms got better.  Hospital Course: Discharge Diagnoses:  Active Problems:   SIRS (systemic inflammatory response syndrome) (HCC)   Leukocytosis   Viral illness   Dehydration  Patient was admitted to the hospital with an episode of dizziness while at work.  She was found to be hypotensive on arrival, had a leukocytosis of 23K as well as an elevated lactic acid of 2.8.  She was somewhat hemoconcentrated with a hemoglobin on the upper limit of normal at 15, suggesting a component of dehydration while at work in a hot environment.  She was afebrile.  She was complaining of a sore throat, and strep test was negative.  She was placed empirically on ceftriaxone out of concern for pharyngitis.  With IV fluids and supportive treatment, she improved, her dizziness resolved, and her white count has improved to 12.  She was feeling well, able to have adequate p.o. intake, her antibiotics were narrowed to doxycycline for 3 more days for presumed URI as she was describing nonspecific sinus pressure/symptoms  on discharge.  Telemetry was reviewed and it was unremarkable.  Her presentation is most likely due to viral illness/transient URI.  Blood cultures were obtained in the emergency room, they are negative at the time of discharge, patient was instructed that should blood cultures become positive she will receive a call to return to the hospital.  She expressed understanding.   Discharge Instructions   Allergies as of 07/14/2017      Reactions   Penicillins Rash   Has patient had a PCN reaction causing immediate rash, facial/tongue/throat swelling, SOB or lightheadedness with hypotension: n/a Has patient had a PCN reaction causing severe rash involving mucus membranes or skin necrosis: n/a Has patient had a PCN reaction that required hospitalization: n/a Has patient had a PCN reaction occurring within the last 10 years: n/a If all of the above answers are "NO", then may proceed with Cephalosporin use      Medication List    STOP taking these medications   omeprazole 20 MG capsule Commonly known as:  PRILOSEC     TAKE these medications   doxycycline 100 MG capsule Commonly known as:  VIBRAMYCIN Take 1 capsule (100 mg total) by mouth 2 (two) times daily.   SINUS & ALLERGY 12 HOUR 120 MG 12 hr tablet Generic drug:  pseudoephedrine Take 120 mg by mouth every 12 (twelve) hours as needed for congestion.            Discharge Care Instructions        Start     Ordered   07/14/17 0000  doxycycline (VIBRAMYCIN) 100 MG capsule  2 times daily     07/14/17 [phone]  Allergies  Allergen Reactions  . Penicillins Rash    Has patient had a PCN reaction causing immediate rash, facial/tongue/throat swelling, SOB or lightheadedness with hypotension: n/a Has patient had a PCN reaction causing severe rash involving mucus membranes or skin necrosis: n/a Has patient had a PCN reaction that required hospitalization: n/a Has patient had a PCN reaction occurring within the last 10 years:  n/a If all of the above answers are "NO", then may proceed with Cephalosporin use     Consultations:  None   Procedures/Studies:   No results found.   Subjective: - no chest pain, shortness of breath, no abdominal pain, nausea or vomiting.   Discharge Exam: Vitals:   07/13/17 2059 07/14/17 0500  BP: (!) 116/56 (!) 109/50  Pulse: 67 (!) 59  Resp: 18   Temp: 98.2 F (36.8 C) 98.5 F (36.9 C)  SpO2: 100%    Vitals:   07/13/17 1741 07/13/17 2050 07/13/17 2059 07/14/17 0500  BP: (!) 101/58  (!) 116/56 (!) 109/50  Pulse: 82  67 (!) 59  Resp: 20  18   Temp: 98.4 F (36.9 C) 98.2 F (36.8 C) 98.2 F (36.8 C) 98.5 F (36.9 C)  TempSrc: Oral Oral Oral Oral  SpO2: 100%  100%     General: Pt is alert, awake, not in acute distress Cardiovascular: RRR, S1/S2 +, no rubs, no gallops Respiratory: CTA bilaterally, no wheezing, no rhonchi   The results of significant diagnostics from this hospitalization (including imaging, microbiology, ancillary and laboratory) are listed below for reference.     Microbiology: Recent Results (from the past 240 hour(s))  Rapid strep screen (not at [facility])     Status: None   Collection Time: 07/13/17 10:25 PM  Result Value Ref Range Status   Streptococcus, Group A Screen (Direct) NEGATIVE NEGATIVE Final    Comment: (NOTE) A Rapid Antigen test may result negative if the antigen level in the sample is below the detection level of this test. The FDA has not cleared this test as a stand-alone test therefore the rapid antigen negative result has reflexed to a Group A Strep culture.      Labs: BNP (last 3 results) No results for input(s): BNP in the last 8760 hours. Basic Metabolic Panel:  Recent Labs Lab 07/13/17 1357 07/14/17 0549  NA 139 139  K 3.4* 3.7  CL 111 112*  CO2 20* 23  GLUCOSE 129* 95  BUN 12 7  CREATININE 0.61 0.51  CALCIUM 7.3* 7.6*   Liver Function Tests: No results for input(s): AST, ALT, ALKPHOS, BILITOT,  PROT, ALBUMIN in the last 168 hours. No results for input(s): LIPASE, AMYLASE in the last 168 hours. No results for input(s): AMMONIA in the last 168 hours. CBC:  Recent Labs Lab 07/13/17 1357 07/13/17 1641 07/14/17 0549  WBC 23.8* 21.2* 12.2*  NEUTROABS  --  18.0*  --   HGB 15.0 14.1 12.3  HCT 44.9 41.9 37.3  MCV 81.6 80.9 81.8  PLT 365 348 337   Cardiac Enzymes: No results for input(s): CKTOTAL, CKMB, CKMBINDEX, TROPONINI in the last 168 hours. BNP: Invalid input(s): POCBNP CBG:  Recent Labs Lab 07/13/17 1400  GLUCAP 138*   D-Dimer No results for input(s): DDIMER in the last 72 hours. Hgb A1c No results for input(s): HGBA1C in the last 72 hours. Lipid Profile No results for input(s): CHOL, HDL, LDLCALC, TRIG, CHOLHDL, LDLDIRECT in the last 72 hours. Thyroid function studies No results for input(s): TSH, T4TOTAL, T3FREE, THYROIDAB  in the last 72 hours.  Invalid input(s): FREET3 Anemia work up No results for input(s): VITAMINB12, FOLATE, FERRITIN, TIBC, IRON, RETICCTPCT in the last 72 hours. Urinalysis    Component Value Date/Time   COLORURINE YELLOW 07/13/2017 1357   APPEARANCEUR HAZY (A) 07/13/2017 1357   LABSPEC 1.020 07/13/2017 1357   PHURINE 6.0 07/13/2017 1357   GLUCOSEU NEGATIVE 07/13/2017 1357   HGBUR SMALL (A) 07/13/2017 1357   BILIRUBINUR NEGATIVE 07/13/2017 1357   KETONESUR NEGATIVE 07/13/2017 1357   PROTEINUR NEGATIVE 07/13/2017 1357   UROBILINOGEN 0.2 10/04/2013 1405   NITRITE NEGATIVE 07/13/2017 1357   LEUKOCYTESUR NEGATIVE 07/13/2017 1357   Sepsis Labs Invalid input(s): PROCALCITONIN,  WBC,  LACTICIDVEN Microbiology Recent Results (from the past 240 hour(s))  Rapid strep screen (not at [facility])     Status: None   Collection Time: 07/13/17 10:25 PM  Result Value Ref Range Status   Streptococcus, Group A Screen (Direct) NEGATIVE NEGATIVE Final    Comment: (NOTE) A Rapid Antigen test may result negative if the antigen level in the sample is  below the detection level of this test. The FDA has not cleared this test as a stand-alone test therefore the rapid antigen negative result has reflexed to a Group A Strep culture.      Time coordinating discharge: 25 minutes  SIGNED:  Marzetta Board, MD  [facility] 07/14/2017, 9:41 AM Pager [phone]  If 7PM-7AM, please contact night-coverage [email or web address] Password [email or web address]

## 2017-07-16 LAB — CULTURE, GROUP A STREP (THRC)

## 2017-07-18 LAB — CULTURE, BLOOD (ROUTINE X 2)
CULTURE: NO GROWTH
Culture: NO GROWTH
Special Requests: ADEQUATE
Special Requests: ADEQUATE

## 2017-09-04 ENCOUNTER — Encounter: Payer: Self-pay | Admitting: Physician Assistant

## 2017-09-04 ENCOUNTER — Ambulatory Visit (INDEPENDENT_AMBULATORY_CARE_PROVIDER_SITE_OTHER): Payer: 59 | Admitting: Physician Assistant

## 2017-09-04 VITALS — BP 120/72 | HR 75 | Temp 98.3°F | Resp 16 | Ht 62.0 in | Wt 162.0 lb

## 2017-09-04 DIAGNOSIS — J029 Acute pharyngitis, unspecified: Secondary | ICD-10-CM

## 2017-09-04 DIAGNOSIS — R05 Cough: Secondary | ICD-10-CM | POA: Diagnosis not present

## 2017-09-04 DIAGNOSIS — J209 Acute bronchitis, unspecified: Secondary | ICD-10-CM | POA: Diagnosis not present

## 2017-09-04 DIAGNOSIS — R059 Cough, unspecified: Secondary | ICD-10-CM

## 2017-09-04 LAB — POCT RAPID STREP A (OFFICE): Rapid Strep A Screen: NEGATIVE

## 2017-09-04 MED ORDER — BENZONATATE 100 MG PO CAPS: 100.0000 mg | ORAL_CAPSULE | Freq: Three times a day (TID) | ORAL | 0 refills | Status: DC | PRN

## 2017-09-04 MED ORDER — HYDROCODONE-HOMATROPINE 5-1.5 MG/5ML PO SYRP: 5.0000 mL | ORAL_SOLUTION | Freq: Three times a day (TID) | ORAL | 0 refills | Status: DC | PRN

## 2017-09-04 MED ORDER — AZITHROMYCIN 250 MG PO TABS: ORAL_TABLET | ORAL | 0 refills | Status: DC

## 2017-09-04 NOTE — Patient Instructions (Addendum)
Take the entire course of Azithromycin, even if you start to feel better.  Hycodan is a controlled substance cough syrup that will help you sleep. Take this only as directed. Do not take this before work or if you plan to drive.  Come back if you are not better in 5-7 days.   Stay well hydrated. Drink 1-3 liters of water daily.   For sore throat try using a honey-based tea. Use 3 teaspoons of honey with juice squeezed from half lemon. Place shaved pieces of ginger into 1/2-1 cup of water and warm over stove top. Then mix the ingredients and repeat every 4 hours as needed.  You can also gargle liquid Benadryl.   Cough Syrup Recipe: Sweet Lemon & Honey Thyme  Ingredients a handful of fresh thyme sprigs   1 pint of water (2 cups)  1/2 cup honey (raw is best, but regular will do)  1/2 lemon chopped Instructions 1. Place the lemon in the pint jar and cover with the honey. The honey will macerate the lemons and draw out liquids which taste so delicious! 2. Meanwhile, toss the thyme leaves into a saucepan and cover them with the water. 3. Bring the water to a gentle simmer and reduce it to half, about a cup of tea. 4. When the tea is reduced and cooled a bit, strain the sprigs & leaves, add it into the pint jar and stir it well. 5. Give it a shake and use a spoonful as needed. 6. Store your homemade cough syrup in the refrigerator for about a month.  What causes a cough? In adults, common causes of a cough include: ?An infection of the airways or lungs (such as the common cold) ?Postnasal drip - Postnasal drip is when mucus from the nose drips down or flows along the back of the throat. Postnasal drip can happen when people have: .A cold .Allergies .A sinus infection - The sinuses are hollow areas in the bones of the face that open into the nose. ?Lung conditions, like asthma and chronic obstructive pulmonary disease (COPD) - Both of these conditions can make it hard to breathe. COPD is  usually caused by smoking. ?Acid reflux - Acid reflux is when the acid that is normally in your stomach backs up into your esophagus (the tube that carries food from your mouth to your stomach). ?A side effect from blood pressure medicines called "ACE inhibitors" ?Smoking cigarettes  Is there anything I can do on my own to get rid of my cough? Yes. To help get rid of your cough, you can: ?Use a humidifier in your bedroom ?Use an over-the-counter cough medicine, or suck on cough drops or hard candy ?Stop smoking, if you smoke ?If you have allergies, avoid the things you are allergic to (like pollen, dust, animals, or mold) If you have acid reflux, your doctor or nurse will tell you which lifestyle changes can help reduce symptoms.    IF you received an x-ray today, you will receive an invoice from [facility]. Please contact [facility] at [phone] with questions or concerns regarding your invoice.   IF you received labwork today, you will receive an invoice from [place]. Please contact [facility] at [phone] with questions or concerns regarding your invoice.   Our billing staff will not be able to assist you with questions regarding bills from these companies.  You will be contacted with the lab results as soon as they are available. The fastest way to get your results  is to activate your My Chart account. Instructions are located on the last page of this paperwork. If you have not heard from [place] regarding the results in 2 weeks, please contact this office.

## 2017-09-04 NOTE — Progress Notes (Signed)
Melanie Frazier  MRN: [id number] DOB: [birth year]  PCP: Patient, No Pcp Per  Subjective:  Pt is a 46 year old female who presents to clinic for cough and nasal congestion. She was diagnosed with "flu-like symptoms" 10 days ago and treated with Doxycycline at an urgent care.   Today she c/o recent onset of worsening cough, chest pain with coughing, nasal congestion. She is not sleeping well at night and has to sleep in upright position. She is taking Sudafed for cough.  Denies fever, chills, n/v, abdominal pain, wheezing, chest pain  Review of Systems  HENT: Positive for congestion, sinus pressure and sore throat.   Respiratory: Positive for cough.   Cardiovascular: Positive for chest pain. Negative for palpitations.  Psychiatric/Behavioral: Positive for sleep disturbance.    Patient Active Problem List   Diagnosis Date Noted  . Leukocytosis 07/14/2017  . Viral illness 07/14/2017  . Dehydration 07/14/2017  . SIRS (systemic inflammatory response syndrome) ([place]) 07/13/2017    Current Outpatient Prescriptions on File Prior to Visit  Medication Sig Dispense Refill  . pseudoephedrine (SINUS & ALLERGY 12 HOUR) 120 MG 12 hr tablet Take 120 mg by mouth every 12 (twelve) hours as needed for congestion.     No current facility-administered medications on file prior to visit.     Allergies  Allergen Reactions  . Penicillins Rash    Has patient had a PCN reaction causing immediate rash, facial/tongue/throat swelling, SOB or lightheadedness with hypotension: n/a Has patient had a PCN reaction causing severe rash involving mucus membranes or skin necrosis: n/a Has patient had a PCN reaction that required hospitalization: n/a Has patient had a PCN reaction occurring within the last 10 years: n/a If all of the above answers are "NO", then may proceed with Cephalosporin use      Objective:  BP 120/72   Pulse 75   Temp 98.3 F (36.8 C) (Oral)   Resp 16   Ht 5\' 2"  (1.575 m)   Wt 162 lb  (73.5 kg)   LMP 08/05/2017   SpO2 100%   BMI 29.63 kg/m   Physical Exam  Constitutional: She is oriented to person, place, and time and well-developed, well-nourished, and in no distress. No distress.  HENT:  Right Ear: Tympanic membrane normal.  Left Ear: Tympanic membrane normal.  Nose: Mucosal edema present. No rhinorrhea. Right sinus exhibits no maxillary sinus tenderness and no frontal sinus tenderness. Left sinus exhibits no maxillary sinus tenderness and no frontal sinus tenderness.  Mouth/Throat: Oropharynx is clear and moist and mucous membranes are normal.  Cardiovascular: Normal rate, regular rhythm and normal heart sounds.   Pulmonary/Chest: Effort normal and breath sounds normal. No respiratory distress. She has no wheezes. She has no rales.  Neurological: She is alert and oriented to person, place, and time. GCS score is 15.  Skin: Skin is warm and dry.  Psychiatric: Mood, memory, affect and judgment normal.  Vitals reviewed.   Results for orders placed or performed in visit on 09/04/17  POCT rapid strep A  Result Value Ref Range   Rapid Strep A Screen Negative Negative    Assessment and Plan :  1. Acute bronchitis, unspecified organism - azithromycin (ZITHROMAX) 250 MG tablet; Take 2 tabs PO x 1 dose, then 1 tab PO QD x 4 days  Dispense: 6 tablet; Refill: 0 - Pt recently treated at an urgent care for "flu-like symptoms" with Doxycycline. Recent onset of worsening symptoms. Encourage rest and hydration. RTC in 5-7 days if  no improvement, consider imaging.  2. Sore throat - POCT rapid strep A - Culture, Group A Strep  3. Cough - HYDROcodone-homatropine (HYCODAN) 5-1.5 MG/5ML syrup; Take 5 mLs by mouth every 8 (eight) hours as needed for cough.  Dispense: 120 mL; Refill: 0 - benzonatate (TESSALON) 100 MG capsule; Take 1-2 capsules (100-200 mg total) by mouth 3 (three) times daily as needed for cough.  Dispense: 40 capsule; Refill: 0   Mercer Pod, PA-C  [facility] [place] 09/04/2017 4:35 PM

## 2017-09-06 LAB — CULTURE, GROUP A STREP: Strep A Culture: NEGATIVE

## 2017-09-12 ENCOUNTER — Ambulatory Visit (INDEPENDENT_AMBULATORY_CARE_PROVIDER_SITE_OTHER): Payer: 59

## 2017-09-12 ENCOUNTER — Ambulatory Visit (INDEPENDENT_AMBULATORY_CARE_PROVIDER_SITE_OTHER): Payer: 59 | Admitting: Physician Assistant

## 2017-09-12 ENCOUNTER — Encounter: Payer: Self-pay | Admitting: Physician Assistant

## 2017-09-12 VITALS — BP 106/68 | HR 78 | Temp 98.3°F | Resp 16 | Ht 62.0 in | Wt 161.2 lb

## 2017-09-12 DIAGNOSIS — R059 Cough, unspecified: Secondary | ICD-10-CM

## 2017-09-12 DIAGNOSIS — J029 Acute pharyngitis, unspecified: Secondary | ICD-10-CM | POA: Diagnosis not present

## 2017-09-12 DIAGNOSIS — R05 Cough: Secondary | ICD-10-CM | POA: Diagnosis not present

## 2017-09-12 LAB — POCT CBC
Granulocyte percent: 64.8 % (ref 37–80)
HCT, POC: 38.9 % (ref 37.7–47.9)
Hemoglobin: 12.9 g/dL (ref 12.2–16.2)
Lymph, poc: 2.6 (ref 0.6–3.4)
MCH, POC: 26.6 pg — AB (ref 27–31.2)
MCHC: 33.1 g/dL (ref 31.8–35.4)
MCV: 80.2 fL (ref 80–97)
MID (cbc): 0.7 (ref 0–0.9)
MPV: 7.4 fL (ref 0–99.8)
POC Granulocyte: 6.1 (ref 2–6.9)
POC LYMPH PERCENT: 28 % (ref 10–50)
POC MID %: 7.2 % (ref 0–12)
Platelet Count, POC: 381 10*3/uL (ref 142–424)
RBC: 4.85 M/uL (ref 4.04–5.48)
RDW, POC: 13.3 %
WBC: 9.4 10*3/uL (ref 4.6–10.2)

## 2017-09-12 MED ORDER — PREDNISONE 20 MG PO TABS: ORAL_TABLET | ORAL | 0 refills | Status: DC

## 2017-09-12 NOTE — Patient Instructions (Addendum)
Take the prednisone taper for the next 6 days. Come back and see me if you are still no better.  Come back sooner if your symptoms take another turn for the worse.   Thank you for coming in today. I hope you feel we met your needs.  Feel free to call PCP if you have any questions or further requests.  Please consider signing up for MyChart if you do not already have it, as this is a great way to communicate with me.  Best,  Whitney McVey, PA-C   Antibiotics Aren't Always The Answer  CDC Urges Public To Be Antibiotics Aware The Centers for Disease Control and Prevention (CDC) encourages patients and families to Be Antibiotics Aware by learning about safe antibiotic use. Each year in the [place], at least 2 million people get infected with antibiotic-resistant bacteria. At least 23,000 die as a result. Antibiotic resistance, one of the most urgent threats to the public's health, occurs when bacteria develop the ability to defeat the drugs designed to kill them.  What Do Antibiotics Treat? When you get a prescription for antibiotics, follow your doctor's instructions carefully.  Antibiotics are critical tools for treating a number of common infections, such as pneumonia, and for life-threatening conditions including sepsis. Antibiotics are only needed for treating certain infections caused by bacteria.  What Don't Antibiotics Treat? Antibiotics do not work on viruses, such as colds and flu, or runny noses, even if the mucus is thick, yellow or green. Antibiotics also won't help some common bacterial infections including most cases of bronchitis, many sinus infections, and some ear infections.  What Are The Side Effects of Antibiotics? Any time antibiotics are used, they can cause side effects and lead to antibiotic resistance. When antibiotics aren't needed, they won't help you, and the side effects could still hurt you. Common side effects range from things like rashes and yeast  infections to severe health problems. More serious side effects include Clostridium difficile infection (also called C. difficile or C. diff), which causes diarrhea that can lead to severe colon damage and death. If you need antibiotics, take them exactly as prescribed. Patients and families can talk to their healthcare professional if they have any questions about their antibiotics, or if they develop side effects, especially diarrhea, since that could be C. difficile, which needs to be treated.  Can I Feel Better Without Antibiotics? Patients and families can ask their healthcare professional about the best way to feel better while their body fights off the virus. Respiratory viruses usually go away in a week or two without treatment.  How Can I Stay Healthy? Regular hand-washing can go a long way toward protecting you from germs.  We can all stay healthy and keep others healthy by cleaning our hands, covering our coughs, staying home when sick, and getting recommended vaccines, for the flu, for example. Antibiotics save lives. When a patient needs antibiotics, the benefits outweigh the risks of side effects and antibiotic resistance. Improving the way we take antibiotics helps keep [place] healthy now, helps fight antibiotic resistance, and ensures that life-saving antibiotics will be available for future generations.  To learn more about antibiotic prescribing and use, visit [employer].    IF you received an x-ray today, you will receive an invoice from [facility]. Please contact [facility] at [phone] with questions or concerns regarding your invoice.   IF you received labwork today, you will receive an invoice from [place]. Please contact [facility] at [phone] with questions or  concerns regarding your invoice.   Our billing staff will not be able to assist you with questions regarding bills from these companies.  You will be contacted with the lab  results as soon as they are available. The fastest way to get your results is to activate your My Chart account. Instructions are located on the last page of this paperwork. If you have not heard from [place] regarding the results in 2 weeks, please contact this office.

## 2017-09-12 NOTE — Progress Notes (Signed)
Melanie Frazier  MRN: [id number] DOB: [birth year]  PCP: Patient, No Pcp Per  Subjective:  Pt is a 46 year old female who presents to clinic for f/u cough.  Today she says her throat is sore x 3 days and c/o increased pressure in her sinuses. + some chills, no fever. She has been sick for about three weeks now.   She was here 10/16 for the same, treated for bronchitis with Z-pack. Negative strep and strep culture.  She was treated with Doxy for "flu-like symptoms" prior to her presenting to PCP.   Review of Systems  Constitutional: Positive for chills. Negative for diaphoresis, fatigue and fever.  HENT: Positive for congestion, postnasal drip, sinus pressure and sore throat. Negative for rhinorrhea and sneezing.   Respiratory: Positive for cough. Negative for chest tightness, shortness of breath and wheezing.   Cardiovascular: Negative for chest pain and palpitations.  Gastrointestinal: Negative for abdominal pain, diarrhea, nausea and vomiting.  Neurological: Negative for weakness, light-headedness and headaches.  Psychiatric/Behavioral: Positive for sleep disturbance.    Patient Active Problem List   Diagnosis Date Noted  . Leukocytosis 07/14/2017  . Viral illness 07/14/2017  . Dehydration 07/14/2017  . SIRS (systemic inflammatory response syndrome) ([place]) 07/13/2017    Current Outpatient Prescriptions on File Prior to Visit  Medication Sig Dispense Refill  . benzonatate (TESSALON) 100 MG capsule Take 1-2 capsules (100-200 mg total) by mouth 3 (three) times daily as needed for cough. 40 capsule 0  . HYDROcodone-homatropine (HYCODAN) 5-1.5 MG/5ML syrup Take 5 mLs by mouth every 8 (eight) hours as needed for cough. 120 mL 0   No current facility-administered medications on file prior to visit.     Allergies  Allergen Reactions  . Penicillins Rash    Has patient had a PCN reaction causing immediate rash, facial/tongue/throat swelling, SOB or lightheadedness with hypotension:  n/a Has patient had a PCN reaction causing severe rash involving mucus membranes or skin necrosis: n/a Has patient had a PCN reaction that required hospitalization: n/a Has patient had a PCN reaction occurring within the last 10 years: n/a If all of the above answers are "NO", then may proceed with Cephalosporin use      Objective:  BP 106/68   Pulse 78   Temp 98.3 F (36.8 C) (Oral)   Resp 16   Ht 5\' 2"  (1.575 m)   Wt 161 lb 3.2 oz (73.1 kg)   LMP 09/05/2017   SpO2 98%   BMI 29.48 kg/m   Physical Exam  Constitutional: She is oriented to person, place, and time and well-developed, well-nourished, and in no distress. No distress.  HENT:  Right Ear: Tympanic membrane normal.  Left Ear: Tympanic membrane normal.  Nose: Mucosal edema present. No rhinorrhea. Right sinus exhibits no maxillary sinus tenderness and no frontal sinus tenderness. Left sinus exhibits no maxillary sinus tenderness and no frontal sinus tenderness.  Mouth/Throat: Uvula is midline and mucous membranes are normal. Posterior oropharyngeal edema present. No oropharyngeal exudate or posterior oropharyngeal erythema.  Eyes: Pupils are equal, round, and reactive to light. Conjunctivae are normal.  Cardiovascular: Normal rate, regular rhythm and normal heart sounds.   Pulmonary/Chest: Effort normal and breath sounds normal. She has no wheezes. She has no rales.  Neurological: She is alert and oriented to person, place, and time. GCS score is 15.  Skin: Skin is warm and dry.  Psychiatric: Mood, memory, affect and judgment normal.  Vitals reviewed.   Dg Chest 2 View  Result Date:  09/12/2017 CLINICAL DATA:  Cough for the past 3 weeks. EXAM: CHEST  2 VIEW COMPARISON:  None. FINDINGS: Normal sized heart. Small amount of linear density at the left lung base. Otherwise, clear lungs. Minimal diffuse peribronchial thickening. Unremarkable bones. IMPRESSION: 1. Minimal bronchitic changes. 2. Small amount of linear atelectasis  or scarring at the left lung base. Electronically Signed   By: Claudie Revering M.D.   On: 09/12/2017 17:23   Results for orders placed or performed in visit on 09/12/17  POCT CBC  Result Value Ref Range   WBC 9.4 4.6 - 10.2 K/uL   Lymph, poc 2.6 0.6 - 3.4   POC LYMPH PERCENT 28.0 10 - 50 %L   MID (cbc) 0.7 0 - 0.9   POC MID % 7.2 0 - 12 %M   POC Granulocyte 6.1 2 - 6.9   Granulocyte percent 64.8 37 - 80 %G   RBC 4.85 4.04 - 5.48 M/uL   Hemoglobin 12.9 12.2 - 16.2 g/dL   HCT, POC 38.9 37.7 - 47.9 %   MCV 80.2 80 - 97 fL   MCH, POC 26.6 (A) 27 - 31.2 pg   MCHC 33.1 31.8 - 35.4 g/dL   RDW, POC 13.3 %   Platelet Count, POC 381 142 - 424 K/uL   MPV 7.4 0 - 99.8 fL    Assessment and Plan :  1. Cough - DG Chest 2 View; Future - predniSONE (DELTASONE) 20 MG tablet; Take 3 PO QAM x2days, 2 PO QAM x2days, 1 PO QAM x2days  Dispense: 12 tablet; Refill: 0 - Negative chest x-ray. Suspect upper airway cough syndrome. Stay well hydrated. RTC in 5-7 days if worsening symptoms. Consider treatment for sinusitis.  2. Sore throat - POCT CBC - labs are wnl  Mercer Pod, PA-C  [facility] [place] 09/12/2017 4:52 PM

## 2017-09-13 ENCOUNTER — Encounter: Payer: Self-pay | Admitting: Physician Assistant

## 2017-09-13 MED ORDER — BENZONATATE 200 MG PO CAPS: 200.0000 mg | ORAL_CAPSULE | Freq: Three times a day (TID) | ORAL | 0 refills | Status: DC | PRN

## 2017-10-03 ENCOUNTER — Other Ambulatory Visit: Payer: Self-pay

## 2017-10-03 ENCOUNTER — Telehealth: Payer: Self-pay | Admitting: *Deleted

## 2017-10-03 ENCOUNTER — Encounter: Payer: Self-pay | Admitting: Physician Assistant

## 2017-10-03 ENCOUNTER — Ambulatory Visit (INDEPENDENT_AMBULATORY_CARE_PROVIDER_SITE_OTHER): Payer: 59 | Admitting: Physician Assistant

## 2017-10-03 VITALS — BP 110/64 | HR 72 | Temp 98.2°F | Resp 16 | Ht 62.0 in | Wt 160.8 lb

## 2017-10-03 DIAGNOSIS — Z0000 Encounter for general adult medical examination without abnormal findings: Secondary | ICD-10-CM | POA: Diagnosis not present

## 2017-10-03 DIAGNOSIS — Z130 Encounter for screening for diseases of the blood and blood-forming organs and certain disorders involving the immune mechanism: Secondary | ICD-10-CM

## 2017-10-03 DIAGNOSIS — G43819 Other migraine, intractable, without status migrainosus: Secondary | ICD-10-CM

## 2017-10-03 DIAGNOSIS — Z13220 Encounter for screening for lipoid disorders: Secondary | ICD-10-CM

## 2017-10-03 DIAGNOSIS — Z1329 Encounter for screening for other suspected endocrine disorder: Secondary | ICD-10-CM | POA: Diagnosis not present

## 2017-10-03 DIAGNOSIS — L989 Disorder of the skin and subcutaneous tissue, unspecified: Secondary | ICD-10-CM | POA: Diagnosis not present

## 2017-10-03 LAB — CMP14+EGFR
ALT: 33 IU/L — ABNORMAL HIGH (ref 0–32)
AST: 28 IU/L (ref 0–40)
Albumin/Globulin Ratio: 1.8 (ref 1.2–2.2)
Albumin: 4.1 g/dL (ref 3.5–5.5)
Alkaline Phosphatase: 61 IU/L (ref 39–117)
BUN/Creatinine Ratio: 15 (ref 9–23)
BUN: 9 mg/dL (ref 6–24)
Bilirubin Total: 0.6 mg/dL (ref 0.0–1.2)
CO2: 24 mmol/L (ref 20–29)
Calcium: 9.1 mg/dL (ref 8.7–10.2)
Chloride: 101 mmol/L (ref 96–106)
Creatinine, Ser: 0.61 mg/dL (ref 0.57–1.00)
GFR calc Af Amer: 125 mL/min/{1.73_m2} (ref >59)
GFR calc non Af Amer: 108 mL/min/{1.73_m2} (ref >59)
Globulin, Total: 2.3 g/dL (ref 1.5–4.5)
Glucose: 84 mg/dL (ref 65–99)
Potassium: 3.8 mmol/L (ref 3.5–5.2)
Sodium: 143 mmol/L (ref 134–144)
Total Protein: 6.4 g/dL (ref 6.0–8.5)

## 2017-10-03 LAB — CBC WITH DIFFERENTIAL/PLATELET
Basophils Absolute: 0.1 10*3/uL (ref 0.0–0.2)
Basos: 1 %
EOS (ABSOLUTE): 0.5 10*3/uL — ABNORMAL HIGH (ref 0.0–0.4)
Eos: 6 %
Hematocrit: 43.4 % (ref 34.0–46.6)
Hemoglobin: 14.1 g/dL (ref 11.1–15.9)
Immature Grans (Abs): 0 10*3/uL (ref 0.0–0.1)
Immature Granulocytes: 0 %
Lymphocytes Absolute: 3.1 10*3/uL (ref 0.7–3.1)
Lymphs: 36 %
MCH: 26.4 pg — ABNORMAL LOW (ref 26.6–33.0)
MCHC: 32.5 g/dL (ref 31.5–35.7)
MCV: 81 fL (ref 79–97)
Monocytes Absolute: 0.7 10*3/uL (ref 0.1–0.9)
Monocytes: 8 %
Neutrophils Absolute: 4.3 10*3/uL (ref 1.4–7.0)
Neutrophils: 49 %
Platelets: 381 10*3/uL — ABNORMAL HIGH (ref 150–379)
RBC: 5.34 x10E6/uL — ABNORMAL HIGH (ref 3.77–5.28)
RDW: 14.1 % (ref 12.3–15.4)
WBC: 8.6 10*3/uL (ref 3.4–10.8)

## 2017-10-03 LAB — POCT URINALYSIS DIP (MANUAL ENTRY)
Bilirubin, UA: NEGATIVE
Glucose, UA: NEGATIVE mg/dL
Ketones, POC UA: NEGATIVE mg/dL
Leukocytes, UA: NEGATIVE
Nitrite, UA: NEGATIVE
Protein Ur, POC: NEGATIVE mg/dL
Spec Grav, UA: 1.015 (ref 1.010–1.025)
Urobilinogen, UA: 0.2 U/dL
pH, UA: 7.5 (ref 5.0–8.0)

## 2017-10-03 LAB — LIPID PANEL
Chol/HDL Ratio: 2.5 ratio (ref 0.0–4.4)
Cholesterol, Total: 154 mg/dL (ref 100–199)
HDL: 62 mg/dL (ref >39)
LDL Calculated: 75 mg/dL (ref 0–99)
Triglycerides: 83 mg/dL (ref 0–149)
VLDL Cholesterol Cal: 17 mg/dL (ref 5–40)

## 2017-10-03 MED ORDER — SUMATRIPTAN SUCCINATE 25 MG PO TABS: 25.0000 mg | ORAL_TABLET | ORAL | 0 refills | Status: DC | PRN

## 2017-10-03 MED ORDER — SUMATRIPTAN SUCCINATE 100 MG PO TABS: 100.0000 mg | ORAL_TABLET | ORAL | 0 refills | Status: DC | PRN

## 2017-10-03 NOTE — Progress Notes (Signed)
[facility] [place], [place] [address] [phone]  Date:  10/03/2017   Name:  Melanie Frazier   DOB:  December 16, 1970   MRN:  [id number]  PCP:  Dorise Hiss, PA-C    Chief Complaint: Annual Exam (no pap)   History of Present Illness:  This is a 46 y.o. female with PMH allergies, anemia, asthma, GERD who is presenting for CPE. She is a Doctor, hospital at [employer].  Very stressful. She has a 26 year old daughter.  She is fasting today.   Complaints: migraines - happen with changes in weather. She has taken Sumatriptan 100 mg in the past, which helps a lot. Last dose about 8 months ago.  LMP: Oct 11. nml Contraception: abstinence  Last pap: [facility]. Last PAP 12/2015 - negative.  Sexual history: abstinence. She has tried "[employer]" a few years ago - not a good experience.  Immunizations: Tdap not UTD Dentist: sees regularly.  Eye: 20/20 b/l.  Diet/Exercise: Fam hx: Mother HLD, Father Heart disease and HLD.  Tobacco/alcohol/substance use: Never smoker, no drug use, does not drink alcohol.   Review of Systems:  Review of Systems  Constitutional: Negative for chills, diaphoresis, fatigue and fever.  HENT: Negative for congestion, postnasal drip, rhinorrhea, sinus pressure, sneezing and sore throat.   Respiratory: Negative for cough, chest tightness, shortness of breath and wheezing.   Cardiovascular: Negative for chest pain and palpitations.  Gastrointestinal: Negative for abdominal pain, diarrhea, nausea and vomiting.  Genitourinary: Negative for decreased urine volume, difficulty urinating, dysuria, enuresis, flank pain, frequency, hematuria and urgency.  Musculoskeletal: Negative for back pain.  Neurological: Positive for headaches. Negative for dizziness, weakness and light-headedness.    Patient Active Problem List   Diagnosis Date Noted  . Leukocytosis 07/14/2017  . Viral illness 07/14/2017  . Dehydration 07/14/2017  .  SIRS (systemic inflammatory response syndrome) ([place]) 07/13/2017    Prior to Admission medications   Medication Sig Start Date End Date Taking? Authorizing Provider  benzonatate (TESSALON) 200 MG capsule Take 1 capsule (200 mg total) by mouth 3 (three) times daily as needed for cough. 09/13/17   Nicki Furlan, Gelene Mink, PA-C  HYDROcodone-homatropine (HYCODAN) 5-1.5 MG/5ML syrup Take 5 mLs by mouth every 8 (eight) hours as needed for cough. 09/04/17   Besse Miron, Gelene Mink, PA-C  predniSONE (DELTASONE) 20 MG tablet Take 3 PO QAM x2days, 2 PO QAM x2days, 1 PO QAM x2days 09/12/17   Isaack Preble, Gelene Mink, PA-C    No Active Allergies  Past Surgical History:  Procedure Laterality Date  . infection giving birth      Social History   Tobacco Use  . Smoking status: Never Smoker  . Smokeless tobacco: Never Used  Substance Use Topics  . Alcohol use: No  . Drug use: No    Family History  Problem Relation Age of Onset  . Hyperlipidemia Mother   . Hyperlipidemia Father     Medication list has been reviewed and updated.  Physical Examination:  Physical Exam  Constitutional: She is oriented to person, place, and time. She appears well-developed and well-nourished. No distress.  HENT:  Head: Normocephalic and atraumatic.  Mouth/Throat: Oropharynx is clear and moist.  Eyes: Conjunctivae and EOM are normal. Pupils are equal, round, and reactive to light.  Cardiovascular: Normal rate, regular rhythm and normal heart sounds.  No murmur heard. Pulmonary/Chest: Effort normal and breath sounds normal. She has no wheezes.  Abdominal: Soft. Normal appearance. There is  no hepatosplenomegaly. There is no tenderness.  Musculoskeletal: Normal range of motion.  Neurological: She is alert and oriented to person, place, and time. She has normal reflexes.  Skin: Skin is warm and dry.  Psychiatric: She has a normal mood and affect. Her behavior is normal. Judgment and thought content normal.   Vitals reviewed.   BP 110/64   Pulse 72   Temp 98.2 F (36.8 C) (Oral)   Resp 16   Ht 5' 2"  (1.575 m)   Wt 160 lb 12.8 oz (72.9 kg)   LMP 08/30/2017   SpO2 99%   BMI 29.41 kg/m   Assessment and Plan: 1. Annual physical exam - Pt presents for annual exam. Last PAP 12/2015 negative. Plans to make appt with [place] for next PAP. Does not perform skin checks, would like referral to derm. C/o occasional HA, plan to refill Imitrex. Discussed other options with her. Plan to try Maxalt in the future. Routine labs are pending, will contact with results.   2. Screening for endocrine disorder - CMP14+EGFR - POCT urinalysis dipstick  3. Screening for deficiency anemia - CBC with Differential/Platelet  4. Screening, lipid - Lipid panel  5. Lesion of skin of face - Ambulatory referral to Dermatology  6. Other migraine without status migrainosus, intractable - SUMAtriptan (IMITREX) 100 MG tablet; Take 1 tablet (100 mg total) every 2 (two) hours as needed by mouth for migraine. May repeat in 2 hours if headache persists or recurs.  Dispense: 10 tablet; Refill: 0   Mercer Pod, PA-C  [facility] [place] 10/03/2017 9:16 AM

## 2017-10-03 NOTE — Patient Instructions (Addendum)
You made a goal today of losing 20 pounds in 5 months.  You can do it!  Sign a release of information form so we can have your medical records in our system.   [place] for acupuncture.   I wrote a Rx for 37m Sumatriptan. Take this with fluids. If a satisfactory response has not been obtained at 2 hours, a second dose may be administered. Results from clinical trials show that initial doses of 50 mg are more effective than doses of 25 mg.   What are the benefits of exercise?-Exercise has many benefits. It can: ?Burn calories, which helps people control their weight ?Help control blood sugar levels in people with diabetes ?Lower blood pressure, especially in people with high blood pressure ?Lower stress and help with depression ?Keep bones strong, so they don't get thin and break easily ?Lower the chance of dying from heart disease   What are the main types of exercise?-There are 3 main types of exercise. They are: ?Aerobic exercise - Aerobic exercise raises a person's heart rate. Examples of aerobic exercise are walking, running, or swimming. ?Resistance training - Resistance training helps make your muscles stronger. People can do this type of exercise using weights, exercise bands, or weight machines. ?Stretching - Stretching exercises help your muscles and joints move more easily. It's important to have all 3 types of exercise in your exercise program. That way, your body, muscles, and joints can be as healthy as possible.  For substantial health benefits, adults are recommended to perform moderate-intensity aerobic exercise or vigorous aerobic exercise as follows: ?Moderate-intensity aerobic exercise for 150 minutes every week AND muscle-strengthening activities involving all major muscle groups at least two days per week, OR ?Vigorous-intensity aerobic exercise for 75 minutes every week AND muscle-strengthening activities involving all major muscle groups at  least two days per week, OR ?An equivalent mix of moderate- and vigorous-intensity aerobic exercise AND muscle-strengthening activities involving all major muscle groups at least two days per week  What should I do when I exercise?-Each time you exercise, you should: ?Warm up - Warming up can help keep you from hurting your muscles when you exercise. To warm up, do a light aerobic exercise (such as walking slowly) or stretch for 5 to 10 minutes. ?Work out - During a workout, you can walk fast, swim, run, or use an exercise machine, for example. You should also stretch all of your joints, including your neck, shoulders, back, hips, and knees. At least 2 times a week, you can add resistance training exercises to your workout. ?Cool down - Cooling down helps keep you from feeling dizzy after you exercise and helps prevent muscle cramps. To cool down, you can stretch or do a light aerobic exercise for 5 minutes.  How often should I exercise?-Doctors recommend that people exercise at least 30 minutes a day, on 5 or more days of the week. If you can't exercise for 30 minutes straight, try to exercise for 10 minutes at a time, 3 or 4 times a day.   Health Maintenance, Female Adopting a healthy lifestyle and getting preventive care can go a long way to promote health and wellness. Talk with your health care provider about what schedule of regular examinations is right for you. This is a good chance for you to check in with your provider about disease prevention and staying healthy. In between checkups, there are plenty of things you can do on your own. Experts have done a lot  of research about which lifestyle changes and preventive measures are most likely to keep you healthy. Ask your health care provider for more information. Weight and diet Eat a healthy diet  Be sure to include plenty of vegetables, fruits, low-fat dairy products, and lean protein.  Do not eat a lot of foods high in solid fats,  added sugars, or salt.  Get regular exercise. This is one of the most important things you can do for your health. ? Most adults should exercise for at least 150 minutes each week. The exercise should increase your heart rate and make you sweat (moderate-intensity exercise). ? Most adults should also do strengthening exercises at least twice a week. This is in addition to the moderate-intensity exercise.  Maintain a healthy weight  Body mass index (BMI) is a measurement that can be used to identify possible weight problems. It estimates body fat based on height and weight. Your health care provider can help determine your BMI and help you achieve or maintain a healthy weight.  For females 98 years of age and older: ? A BMI below 18.5 is considered underweight. ? A BMI of 18.5 to 24.9 is normal. ? A BMI of 25 to 29.9 is considered overweight. ? A BMI of 30 and above is considered obese.  Watch levels of cholesterol and blood lipids  You should start having your blood tested for lipids and cholesterol at 47 years of age, then have this test every 5 years.  You may need to have your cholesterol levels checked more often if: ? Your lipid or cholesterol levels are high. ? You are older than 47 years of age. ? You are at high risk for heart disease.  Cancer screening Lung Cancer  Lung cancer screening is recommended for adults 51-43 years old who are at high risk for lung cancer because of a history of smoking.  A yearly low-dose CT scan of the lungs is recommended for people who: ? Currently smoke. ? Have quit within the past 15 years. ? Have at least a 30-pack-year history of smoking. A pack year is smoking an average of one pack of cigarettes a day for 1 year.  Yearly screening should continue until it has been 15 years since you quit.  Yearly screening should stop if you develop a health problem that would prevent you from having lung cancer treatment.  Breast Cancer  Practice  breast self-awareness. This means understanding how your breasts normally appear and feel.  It also means doing regular breast self-exams. Let your health care provider know about any changes, no matter how small.  If you are in your 20s or 30s, you should have a clinical breast exam (CBE) by a health care provider every 1-3 years as part of a regular health exam.  If you are 17 or older, have a CBE every year. Also consider having a breast X-ray (mammogram) every year.  If you have a family history of breast cancer, talk to your health care provider about genetic screening.  If you are at high risk for breast cancer, talk to your health care provider about having an MRI and a mammogram every year.  Breast cancer gene (BRCA) assessment is recommended for women who have family members with BRCA-related cancers. BRCA-related cancers include: ? Breast. ? Ovarian. ? Tubal. ? Peritoneal cancers.  Results of the assessment will determine the need for genetic counseling and BRCA1 and BRCA2 testing.  Cervical Cancer Your health care provider may recommend that  you be screened regularly for cancer of the pelvic organs (ovaries, uterus, and vagina). This screening involves a pelvic examination, including checking for microscopic changes to the surface of your cervix (Pap test). You may be encouraged to have this screening done every 3 years, beginning at age 62.  For women ages 60-65, health care providers may recommend pelvic exams and Pap testing every 3 years, or they may recommend the Pap and pelvic exam, combined with testing for human papilloma virus (HPV), every 5 years. Some types of HPV increase your risk of cervical cancer. Testing for HPV may also be done on women of any age with unclear Pap test results.  Other health care providers may not recommend any screening for nonpregnant women who are considered low risk for pelvic cancer and who do not have symptoms. Ask your health care provider  if a screening pelvic exam is right for you.  If you have had past treatment for cervical cancer or a condition that could lead to cancer, you need Pap tests and screening for cancer for at least 20 years after your treatment. If Pap tests have been discontinued, your risk factors (such as having a new sexual partner) need to be reassessed to determine if screening should resume. Some women have medical problems that increase the chance of getting cervical cancer. In these cases, your health care provider may recommend more frequent screening and Pap tests.  Colorectal Cancer  This type of cancer can be detected and often prevented.  Routine colorectal cancer screening usually begins at 47 years of age and continues through 47 years of age.  Your health care provider may recommend screening at an earlier age if you have risk factors for colon cancer.  Your health care provider may also recommend using home test kits to check for hidden blood in the stool.  A small camera at the end of a tube can be used to examine your colon directly (sigmoidoscopy or colonoscopy). This is done to check for the earliest forms of colorectal cancer.  Routine screening usually begins at age 38.  Direct examination of the colon should be repeated every 5-10 years through 47 years of age. However, you may need to be screened more often if early forms of precancerous polyps or small growths are found.  Skin Cancer  Check your skin from head to toe regularly.  Tell your health care provider about any new moles or changes in moles, especially if there is a change in a mole's shape or color.  Also tell your health care provider if you have a mole that is larger than the size of a pencil eraser.  Always use sunscreen. Apply sunscreen liberally and repeatedly throughout the day.  Protect yourself by wearing long sleeves, pants, a wide-brimmed hat, and sunglasses whenever you are outside.  Heart disease,  diabetes, and high blood pressure  High blood pressure causes heart disease and increases the risk of stroke. High blood pressure is more likely to develop in: ? People who have blood pressure in the high end of the normal range (130-139/85-89 mm Hg). ? People who are overweight or obese. ? People who are African American.  If you are 32-45 years of age, have your blood pressure checked every 3-5 years. If you are 9 years of age or older, have your blood pressure checked every year. You should have your blood pressure measured twice-once when you are at a hospital or clinic, and once when you are not at  a hospital or clinic. Record the average of the two measurements. To check your blood pressure when you are not at a hospital or clinic, you can use: ? An automated blood pressure machine at a pharmacy. ? A home blood pressure monitor.  If you are between 76 years and 43 years old, ask your health care provider if you should take aspirin to prevent strokes.  Have regular diabetes screenings. This involves taking a blood sample to check your fasting blood sugar level. ? If you are at a normal weight and have a low risk for diabetes, have this test once every three years after 47 years of age. ? If you are overweight and have a high risk for diabetes, consider being tested at a younger age or more often. Preventing infection Hepatitis B  If you have a higher risk for hepatitis B, you should be screened for this virus. You are considered at high risk for hepatitis B if: ? You were born in a country where hepatitis B is common. Ask your health care provider which countries are considered high risk. ? Your parents were born in a high-risk country, and you have not been immunized against hepatitis B (hepatitis B vaccine). ? You have HIV or AIDS. ? You use needles to inject street drugs. ? You live with someone who has hepatitis B. ? You have had sex with someone who has hepatitis B. ? You get  hemodialysis treatment. ? You take certain medicines for conditions, including cancer, organ transplantation, and autoimmune conditions.  Hepatitis C  Blood testing is recommended for: ? Everyone born from [birth year] through 1966. ? Anyone with known risk factors for hepatitis C.  Sexually transmitted infections (STIs)  You should be screened for sexually transmitted infections (STIs) including gonorrhea and chlamydia if: ? You are sexually active and are younger than 47 years of age. ? You are older than 47 years of age and your health care provider tells you that you are at risk for this type of infection. ? Your sexual activity has changed since you were last screened and you are at an increased risk for chlamydia or gonorrhea. Ask your health care provider if you are at risk.  If you do not have HIV, but are at risk, it may be recommended that you take a prescription medicine daily to prevent HIV infection. This is called pre-exposure prophylaxis (PrEP). You are considered at risk if: ? You are sexually active and do not regularly use condoms or know the HIV status of your partner(s). ? You take drugs by injection. ? You are sexually active with a partner who has HIV.  Talk with your health care provider about whether you are at high risk of being infected with HIV. If you choose to begin PrEP, you should first be tested for HIV. You should then be tested every 3 months for as long as you are taking PrEP. Pregnancy  If you are premenopausal and you may become pregnant, ask your health care provider about preconception counseling.  If you may become pregnant, take 400 to 800 micrograms (mcg) of folic acid every day.  If you want to prevent pregnancy, talk to your health care provider about birth control (contraception). Osteoporosis and menopause  Osteoporosis is a disease in which the bones lose minerals and strength with aging. This can result in serious bone fractures. Your risk for  osteoporosis can be identified using a bone density scan.  If you are 73 years of age  or older, or if you are at risk for osteoporosis and fractures, ask your health care provider if you should be screened.  Ask your health care provider whether you should take a calcium or vitamin D supplement to lower your risk for osteoporosis.  Menopause may have certain physical symptoms and risks.  Hormone replacement therapy may reduce some of these symptoms and risks. Talk to your health care provider about whether hormone replacement therapy is right for you. Follow these instructions at home:  Schedule regular health, dental, and eye exams.  Stay current with your immunizations.  Do not use any tobacco products including cigarettes, chewing tobacco, or electronic cigarettes.  If you are pregnant, do not drink alcohol.  If you are breastfeeding, limit how much and how often you drink alcohol.  Limit alcohol intake to no more than 1 drink per day for nonpregnant women. One drink equals 12 ounces of beer, 5 ounces of wine, or 1 ounces of hard liquor.  Do not use street drugs.  Do not share needles.  Ask your health care provider for help if you need support or information about quitting drugs.  Tell your health care provider if you often feel depressed.  Tell your health care provider if you have ever been abused or do not feel safe at home. This information is not intended to replace advice given to you by your health care provider. Make sure you discuss any questions you have with your health care provider. Document Released: 05/22/2011 Document Revised: 04/13/2016 Document Reviewed: 08/10/2015 Elsevier Interactive Patient Education  2018 [employer].  IF you received an x-ray today, you will receive an invoice from [facility]. Please contact [facility] at [phone] with questions or concerns regarding your invoice.   IF you received labwork today, you will  receive an invoice from [facility]. Please contact [facility] at [phone] with questions or concerns regarding your invoice.   Our billing staff will not be able to assist you with questions regarding bills from these companies.  You will be contacted with the lab results as soon as they are available. The fastest way to get your results is to activate your My Chart account. Instructions are located on the last page of this paperwork. If you have not heard from [place] regarding the results in 2 weeks, please contact this office.

## 2017-10-03 NOTE — Telephone Encounter (Signed)
LMOM to advise pt, Rx for Sumatriptan 100 mg was sent to [facility] stated that pt had already picked up Sumatriptan 25 mg.

## 2017-10-18 ENCOUNTER — Encounter: Payer: Self-pay | Admitting: Physician Assistant

## 2017-12-10 ENCOUNTER — Other Ambulatory Visit: Payer: Self-pay | Admitting: Physician Assistant

## 2017-12-10 DIAGNOSIS — G43819 Other migraine, intractable, without status migrainosus: Secondary | ICD-10-CM

## 2017-12-11 MED ORDER — SUMATRIPTAN SUCCINATE 100 MG PO TABS: 100.0000 mg | ORAL_TABLET | ORAL | 0 refills | Status: DC | PRN

## 2017-12-19 DIAGNOSIS — C44321 Squamous cell carcinoma of skin of nose: Secondary | ICD-10-CM | POA: Diagnosis not present

## 2017-12-19 DIAGNOSIS — L718 Other rosacea: Secondary | ICD-10-CM | POA: Diagnosis not present

## 2017-12-19 DIAGNOSIS — L821 Other seborrheic keratosis: Secondary | ICD-10-CM | POA: Diagnosis not present

## 2017-12-19 DIAGNOSIS — C44311 Basal cell carcinoma of skin of nose: Secondary | ICD-10-CM | POA: Diagnosis not present

## 2017-12-19 DIAGNOSIS — D225 Melanocytic nevi of trunk: Secondary | ICD-10-CM | POA: Diagnosis not present

## 2018-01-07 ENCOUNTER — Encounter: Payer: Self-pay | Admitting: Physician Assistant

## 2018-01-07 DIAGNOSIS — G43819 Other migraine, intractable, without status migrainosus: Secondary | ICD-10-CM

## 2018-01-08 NOTE — Telephone Encounter (Signed)
Patient is requesting a refill of the following medications: Requested Prescriptions   Pending Prescriptions Disp Refills  . SUMAtriptan (IMITREX) 100 MG tablet 20 tablet 0    Sig: Take 1 tablet (100 mg total) by mouth every 2 (two) hours as needed for migraine. May repeat in 2 hours if headache persists or recurs.    Date of patient request: 01/07/2018 Last office visit: 10/03/2017 Date of last refill: 12/11/2017 Last refill amount: #10 tabs, no refills Follow up time period per chart: None

## 2018-01-14 MED ORDER — SUMATRIPTAN SUCCINATE 100 MG PO TABS: 100.0000 mg | ORAL_TABLET | ORAL | 0 refills | Status: DC | PRN

## 2018-01-23 DIAGNOSIS — C44319 Basal cell carcinoma of skin of other parts of face: Secondary | ICD-10-CM | POA: Diagnosis not present

## 2018-10-14 ENCOUNTER — Ambulatory Visit (INDEPENDENT_AMBULATORY_CARE_PROVIDER_SITE_OTHER): Payer: 59 | Admitting: Family Medicine

## 2018-10-14 ENCOUNTER — Encounter: Payer: Self-pay | Admitting: Family Medicine

## 2018-10-14 ENCOUNTER — Other Ambulatory Visit: Payer: Self-pay

## 2018-10-14 VITALS — BP 127/68 | HR 78 | Temp 98.5°F | Resp 18 | Ht 62.0 in | Wt 161.4 lb

## 2018-10-14 DIAGNOSIS — J029 Acute pharyngitis, unspecified: Secondary | ICD-10-CM | POA: Diagnosis not present

## 2018-10-14 DIAGNOSIS — B349 Viral infection, unspecified: Secondary | ICD-10-CM

## 2018-10-14 DIAGNOSIS — R059 Cough, unspecified: Secondary | ICD-10-CM

## 2018-10-14 DIAGNOSIS — R05 Cough: Secondary | ICD-10-CM

## 2018-10-14 MED ORDER — PREDNISONE 20 MG PO TABS: ORAL_TABLET | ORAL | 0 refills | Status: DC

## 2018-10-14 MED ORDER — AZITHROMYCIN 250 MG PO TABS: ORAL_TABLET | ORAL | 0 refills | Status: DC

## 2018-10-14 MED ORDER — BENZONATATE 100 MG PO CAPS: 100.0000 mg | ORAL_CAPSULE | Freq: Three times a day (TID) | ORAL | 0 refills | Status: DC | PRN

## 2018-10-14 NOTE — Progress Notes (Signed)
Patient ID: Melanie Frazier, female    DOB: January 04, 1971  Age: 47 y.o. MRN: [id number]  Chief Complaint  Patient presents with  . Headache    x5days and getting worse   . Cough    having some chest tightness   . 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  She has been sick for about 5 days.  She started with just generalized malaise and got a increased illness with headache and cough and sore throat.  The cough is been keeping her from being able to sleep at night.  She has continued to work and is scheduled to do so through Wednesday.  She does not smoke.  She has not had any documented fevers.  No ear complaints.  Some nasal drainage.  Primarily is coughing and bringing up a purulent mucus.  Current allergies, medications, problem list, past/family and social histories reviewed.  Objective:  BP 127/68   Pulse 78   Temp 98.5 F (36.9 C) (Oral)   Resp 18   Ht 5\' 2"  (1.575 m)   Wt 161 lb 6.4 oz (73.2 kg)   LMP 10/12/2018   SpO2 98%   BMI 29.52 kg/m   No major acute distress.  TMs are normal.  Throat minimally erythematous.  Neck supple without any significant nodes.  Chest is clear to auscultation but on forced expiration has spasming and coughing.  Heart regular without murmur.  Assessment & Plan:   Assessment: 1. Acute viral syndrome   2. Cough   3. Sore throat       Plan: We will treat symptomatically for viral illness.  If she does not improving she is to let me know.  Actually will give her some antibiotics not to take at this point, but if she is not doing better over the next 2 to 3 days she can begin them if getting worse.  That will save her from having to try to get the doctor on the holidays.  Orders Placed This Encounter  Procedures  . POCT rapid strep A    Meds ordered this encounter  Medications  . benzonatate (TESSALON) 100 MG capsule    Sig: Take 1-2 capsules (100-200 mg total) by mouth 3 (three) times daily as needed.     Dispense:  30 capsule    Refill:  0  . predniSONE (DELTASONE) 20 MG tablet    Sig: Take 3 daily for 3 days for airway spasm    Dispense:  9 tablet    Refill:  0  . azithromycin (ZITHROMAX) 250 MG tablet    Sig: Take 2 tabs PO x 1 dose, then 1 tab PO QD x 4 days    Dispense:  6 tablet    Refill:  0    Do not fill after 10/21/18         Patient Instructions   Drink plenty of fluids and get enough rest  Take over-the-counter Mucinex to help thin secretions.  You can use Mucinex DM which has some cough suppressant and it also.  Take prednisone 20 mg 3 pills daily for 3 days to help break the spasming in your airways  Use benzonatate cough pills 1 or 2 pills 3 times daily as needed for daytime cough  Tylenol 500 mg 2 pills 3 times daily or ibuprofen 200 mg 3 or 4 pills 3 times daily as needed for body aches or fevers  z pack to use if not  improving over next 3 days or so.  Return if worse     If you have lab work done today you will be contacted with your lab results within the next 2 weeks.  If you have not heard from [place] then please contact us. The fastest way to get your results is to register for My Chart.   IF you received an x-ray today, you will receive an invoice from [facility]. Please contact [facility] at [phone] with questions or concerns regarding your invoice.   IF you received labwork today, you will receive an invoice from [place]. Please contact [facility] at [phone] with questions or concerns regarding your invoice.   Our billing staff will not be able to assist you with questions regarding bills from these companies.  You will be contacted with the lab results as soon as they are available. The fastest way to get your results is to activate your My Chart account. Instructions are located on the last page of this paperwork. If you have not heard from [place] regarding the results in 2 weeks, please contact this office.         Return if symptoms worsen or fail to improve.   Ruben Reason, MD 10/14/2018

## 2018-10-14 NOTE — Patient Instructions (Addendum)
Drink plenty of fluids and get enough rest  Take over-the-counter Mucinex to help thin secretions.  You can use Mucinex DM which has some cough suppressant and it also.  Take prednisone 20 mg 3 pills daily for 3 days to help break the spasming in your airways  Use benzonatate cough pills 1 or 2 pills 3 times daily as needed for daytime cough  Tylenol 500 mg 2 pills 3 times daily or ibuprofen 200 mg 3 or 4 pills 3 times daily as needed for body aches or fevers  z pack to use if not improving over next 3 days or so.  Return if worse     If you have lab work done today you will be contacted with your lab results within the next 2 weeks.  If you have not heard from [place] then please contact us. The fastest way to get your results is to register for My Chart.   IF you received an x-ray today, you will receive an invoice from [facility]. Please contact [facility] at [phone] with questions or concerns regarding your invoice.   IF you received labwork today, you will receive an invoice from [place]. Please contact [facility] at [phone] with questions or concerns regarding your invoice.   Our billing staff will not be able to assist you with questions regarding bills from these companies.  You will be contacted with the lab results as soon as they are available. The fastest way to get your results is to activate your My Chart account. Instructions are located on the last page of this paperwork. If you have not heard from [place] regarding the results in 2 weeks, please contact this office.

## 2018-10-24 NOTE — Progress Notes (Signed)
Chief Complaint  Patient presents with  . Nasal Congestion    X 2 weeks  . Chest Congestion    X 2 week  . Cough    X 2 weeks- runny nose  . Headache    X 2 weeks    HPI  Patient reports nasal congestion, chest congestion, cough, headache x 2 weeks She reports that she has fevers or chills Denies nausea or vomiting  She has childhood asthma and denies symptoms She gets some chest pains when she coughs  Past Medical History:  Diagnosis Date  . Allergy    seasonal  . Anemia   . Asthma    childhood  . GERD (gastroesophageal reflux disease)     Current Outpatient Medications  Medication Sig Dispense Refill  . albuterol (PROVENTIL HFA;VENTOLIN HFA) 108 (90 Base) MCG/ACT inhaler Inhale 2 puffs into the lungs every 6 (six) hours as needed for wheezing or shortness of breath. 1 Inhaler 0  . azithromycin (ZITHROMAX) 250 MG tablet Take 2 tabs PO x 1 dose, then 1 tab PO QD x 4 days (Patient not taking: Reported on 10/25/2018) 6 tablet 0  . benzonatate (TESSALON) 100 MG capsule Take 1-2 capsules (100-200 mg total) by mouth 3 (three) times daily as needed. (Patient not taking: Reported on 10/25/2018) 30 capsule 0  . predniSONE (DELTASONE) 20 MG tablet Take 3 daily for 3 days for airway spasm (Patient not taking: Reported on 10/25/2018) 9 tablet 0  . SUMAtriptan (IMITREX) 100 MG tablet Take 1 tablet (100 mg total) by mouth every 2 (two) hours as needed for migraine. May repeat in 2 hours if headache persists or recurs. (Patient not taking: Reported on 10/25/2018) 20 tablet 0   No current facility-administered medications for this visit.     Allergies: No Known Allergies  Past Surgical History:  Procedure Laterality Date  . infection giving birth      Social History   Socioeconomic History  . Marital status: Divorced    Spouse name: Not on file  . Number of children: 1  . Years of education: Not on file  . Highest education level: Not on file  Occupational History  . Not on  file  Social Needs  . Financial resource strain: Not on file  . Food insecurity:    Worry: Not on file    Inability: Not on file  . Transportation needs:    Medical: Not on file    Non-medical: Not on file  Tobacco Use  . Smoking status: Never Smoker  . Smokeless tobacco: Never Used  Substance and Sexual Activity  . Alcohol use: No  . Drug use: No  . Sexual activity: Yes  Lifestyle  . Physical activity:    Days per week: Not on file    Minutes per session: Not on file  . Stress: Not on file  Relationships  . Social connections:    Talks on phone: Not on file    Gets together: Not on file    Attends religious service: Not on file    Active member of club or organization: Not on file    Attends meetings of clubs or organizations: Not on file    Relationship status: Not on file  Other Topics Concern  . Not on file  Social History Narrative  . Not on file    Family History  Problem Relation Age of Onset  . Hyperlipidemia Mother   . Hyperlipidemia Father   . Heart disease Father  ROS Review of Systems See HPI Constitution: No fevers or chills No malaise No diaphoresis Skin: No rash or itching Eyes: no blurry vision, no double vision GU: no dysuria or hematuria Neuro: no dizziness or headaches  all others reviewed and negative   Objective: Vitals:   10/25/18 1658  BP: 121/62  Pulse: 64  Resp: 18  Temp: 97.9 F (36.6 C)  TempSrc: Oral  SpO2: 99%  Weight: 162 lb 6.4 oz (73.7 kg)  Height: 5' 2.91" (1.598 m)    Physical Exam General: alert, oriented, in NAD Head: normocephalic, atraumatic, no sinus tenderness Eyes: EOM intact, no scleral icterus or conjunctival injection Ears: TM clear bilaterally Nose: mucosa nonerythematous, nonedematous Throat: no pharyngeal exudate or erythema Lymph: no posterior auricular, submental or cervical lymph adenopathy Heart: normal rate, normal sinus rhythm, no murmurs Lungs: clear to auscultation bilaterally, no  wheezing  ecg no st elevation, sinus rhythm  Assessment and Plan Melanie Frazier was seen today for nasal congestion, chest congestion, cough and headache.  Diagnoses and all orders for this visit:  Other chest pain- ekg unconcerning at this point, should follow up for repeat ecg if symptoms persist -     EKG 12-Lead  Acute viral syndrome- Discussed viral etiology Discussed otc decongestant Advised flonase for congestion  Increase hydration Vitamin C and zinc lozenges suggested Return to clinic if symptoms worse  Other orders -     albuterol (PROVENTIL HFA;VENTOLIN HFA) 108 (90 Base) MCG/ACT inhaler; Inhale 2 puffs into the lungs every 6 (six) hours as needed for wheezing or shortness of breath.     Melanie Frazier

## 2018-10-25 ENCOUNTER — Ambulatory Visit (INDEPENDENT_AMBULATORY_CARE_PROVIDER_SITE_OTHER): Payer: 59 | Admitting: Family Medicine

## 2018-10-25 ENCOUNTER — Encounter: Payer: Self-pay | Admitting: Family Medicine

## 2018-10-25 ENCOUNTER — Other Ambulatory Visit: Payer: Self-pay

## 2018-10-25 VITALS — BP 121/62 | HR 64 | Temp 97.9°F | Resp 18 | Ht 62.91 in | Wt 162.4 lb

## 2018-10-25 DIAGNOSIS — R0789 Other chest pain: Secondary | ICD-10-CM | POA: Diagnosis not present

## 2018-10-25 DIAGNOSIS — B349 Viral infection, unspecified: Secondary | ICD-10-CM

## 2018-10-25 MED ORDER — ALBUTEROL SULFATE HFA 108 (90 BASE) MCG/ACT IN AERS: 2.0000 | INHALATION_SPRAY | Freq: Four times a day (QID) | RESPIRATORY_TRACT | 0 refills | Status: DC | PRN

## 2018-10-25 NOTE — Patient Instructions (Signed)
° ° ° °  If you have lab work done today you will be contacted with your lab results within the next 2 weeks.  If you have not heard from us then please contact us. The fastest way to get your results is to register for My Chart. ° ° °IF you received an x-ray today, you will receive an invoice from [facility]. Please contact [facility] at [phone] with questions or concerns regarding your invoice.  ° °IF you received labwork today, you will receive an invoice from [facility]. Please contact [facility] at [phone] with questions or concerns regarding your invoice.  ° °Our billing staff will not be able to assist you with questions regarding bills from these companies. ° °You will be contacted with the lab results as soon as they are available. The fastest way to get your results is to activate your My Chart account. Instructions are located on the last page of this paperwork. If you have not heard from us regarding the results in 2 weeks, please contact this office. °  ° ° ° °

## 2018-11-19 ENCOUNTER — Other Ambulatory Visit: Payer: Self-pay | Admitting: Family Medicine

## 2018-11-19 NOTE — Telephone Encounter (Signed)
Requested Prescriptions  Pending Prescriptions Disp Refills  . VENTOLIN HFA 108 (90 Base) MCG/ACT inhaler [Pharmacy Med Name: VENTOLIN HFA 90 MCG INHALER] 18 Inhaler 0    Sig: TAKE 2 PUFFS BY MOUTH EVERY 6 HOURS AS NEEDED FOR WHEEZE OR SHORTNESS OF BREATH     Pulmonology:  Beta Agonists Failed - 11/19/2018 12:07 AM      Failed - One inhaler should last at least one month. If the patient is requesting refills earlier, contact the patient to check for uncontrolled symptoms.      Failed - Valid encounter within last 12 months    Recent Outpatient Visits          3 weeks ago Other chest pain   Primary Care at [facility], Arlie Solomons, MD   1 month ago Acute viral syndrome   Primary Care at [facility], Fenton Malling, MD   1 year ago Annual physical exam   [facility] at [facility], Gelene Mink, PA-C   1 year ago Cough   Primary Care at [facility], Gelene Mink, PA-C   1 year ago Acute bronchitis, unspecified organism   Primary Care at [facility], [place], [place]

## 2018-12-09 ENCOUNTER — Encounter: Payer: Self-pay | Admitting: Family Medicine

## 2018-12-09 ENCOUNTER — Other Ambulatory Visit: Payer: Self-pay

## 2018-12-09 ENCOUNTER — Ambulatory Visit (INDEPENDENT_AMBULATORY_CARE_PROVIDER_SITE_OTHER): Payer: BLUE CROSS/BLUE SHIELD | Admitting: Family Medicine

## 2018-12-09 VITALS — BP 107/70 | HR 84 | Temp 98.8°F | Resp 16 | Ht 62.0 in | Wt 165.0 lb

## 2018-12-09 DIAGNOSIS — R5383 Other fatigue: Secondary | ICD-10-CM | POA: Diagnosis not present

## 2018-12-09 DIAGNOSIS — J029 Acute pharyngitis, unspecified: Secondary | ICD-10-CM

## 2018-12-09 DIAGNOSIS — J069 Acute upper respiratory infection, unspecified: Secondary | ICD-10-CM

## 2018-12-09 LAB — POCT INFLUENZA A/B
INFLUENZA A, POC: NEGATIVE
INFLUENZA B, POC: NEGATIVE

## 2018-12-09 LAB — POCT RAPID STREP A (OFFICE): Rapid Strep A Screen: NEGATIVE

## 2018-12-09 NOTE — Progress Notes (Signed)
I, [employer], acting as a Education administrator for Wendie Agreste, MD, have documented all relevant documentation on the behalf of Wendie Agreste, MD, as directed by Wendie Agreste, MD while in the presence of Wendie Agreste, MD.    Subjective:    Patient ID: Melanie Frazier, female    DOB: 02/20/1971, 48 y.o.   MRN: [id number]  Chief Complaint  Patient presents with  . Generalized Body Aches    x 3 days  . Fatigue    x 3 days  . Sore Throat    x 3 days     HPI Melanie Frazier is a 48 y.o. female who presents to Primary Care at [facility] complaining of body aches, fatigue, and sore throat for the past three days.   She was seen with two weeks of URI symptoms on 10/25/2018.  Suspected viral cause; sympoms treated with positives results.  Daughter has recently been sick as well with similar symptoms.  Pt has not used her albuterol inhaler.   Treated with Alkaseltzer cold   Review of Systems  Constitutional: Positive for diaphoresis and fatigue. Negative for fever.  HENT: Positive for sore throat.   Respiratory: Positive for cough (with yellow mucus).    Pt left prior to AVS, but will receive on MyChart.  Vitals:   12/09/18 1614  BP: 107/70  Pulse: 84  Resp: 16  Temp: 98.8 F (37.1 C)  TempSrc: Oral  SpO2: 98%  Weight: 165 lb (74.8 kg)  Height: 5\' 2"  (1.575 m)        Objective:   Physical Exam Vitals signs reviewed.  Constitutional:      General: She is not in acute distress.    Appearance: She is well-developed.  HENT:     Head: Normocephalic and atraumatic.     Right Ear: Hearing, tympanic membrane, ear canal and external ear normal.     Left Ear: Hearing, tympanic membrane, ear canal and external ear normal.     Nose: Nose normal.     Mouth/Throat:     Pharynx: No oropharyngeal exudate.     Comments: Some erythema in throat No lymph nodes felt Eyes:     Conjunctiva/sclera: Conjunctivae normal.     Pupils: Pupils are equal, round, and reactive to light.    Cardiovascular:     Rate and Rhythm: Normal rate and regular rhythm.     Heart sounds: Normal heart sounds. No murmur.  Pulmonary:     Effort: Pulmonary effort is normal. No respiratory distress.     Breath sounds: Normal breath sounds. No wheezing, rhonchi or rales.  Skin:    General: Skin is warm and dry.     Findings: No rash.  Neurological:     Mental Status: She is alert and oriented to person, place, and time.  Psychiatric:        Behavior: Behavior normal.       Assessment & Plan:   Melanie Frazier is a 48 y.o. female Sore throat - Plan: POCT rapid strep A, Culture, Group A Strep  Other fatigue - Plan: POCT Influenza A/B  Acute upper respiratory infection - Plan: Culture, Group A Strep Suspected viral syndrome, negative strep testing.  Symptomatic care discussed and handout provided via mychart.  RTC precautions given.  No orders of the defined types were placed in this encounter.  Patient Instructions   Unfortunately symptoms are likely due to a virus.  Strep testing and flu testing was negative/normal today, but  I will try to send a throat culture to make sure.  See information below on sore throat.  You can try Cepacol lozenges, and for cough Mucinex is fine.  Make sure to drink plenty of fluids and rest.  Tylenol or ibuprofen if needed for body aches. Thank you for coming in today.   Sore Throat A sore throat is pain, burning, irritation, or scratchiness in the throat. When you have a sore throat, you may feel pain or tenderness in your throat when you swallow or talk. Many things can cause a sore throat, including:  An infection.  Seasonal allergies.  Dryness in the air.  Irritants, such as smoke or pollution.  Radiation treatment to the area.  Gastroesophageal reflux disease (GERD).  A tumor. A sore throat is often the first sign of another sickness. It may happen with other symptoms, such as coughing, sneezing, fever, and swollen neck glands. Most sore  throats go away without medical treatment. Follow these instructions at home:      Take over-the-counter medicines only as told by your health care provider. ? If your child has a sore throat, do not give your child aspirin because of the association with Reye syndrome.  Drink enough fluids to keep your urine pale yellow.  Rest as needed.  To help with pain, try: ? Sipping warm liquids, such as broth, herbal tea, or warm water. ? Eating or drinking cold or frozen liquids, such as frozen ice pops. ? Gargling with a salt-water mixture 3-4 times a day or as needed. To make a salt-water mixture, completely dissolve -1 tsp (3-6 g) of salt in 1 cup (237 mL) of warm water. ? Sucking on hard candy or throat lozenges. ? Putting a cool-mist humidifier in your bedroom at night to moisten the air. ? Sitting in the bathroom with the door closed for 5-10 minutes while you run hot water in the shower.  Do not use any products that contain nicotine or tobacco, such as cigarettes, e-cigarettes, and chewing tobacco. If you need help quitting, ask your health care provider.  Wash your hands well and often with soap and water. If soap and water are not available, use hand sanitizer. Contact a health care provider if:  You have a fever for more than 2-3 days.  You have symptoms that last (are persistent) for more than 2-3 days.  Your throat does not get better within 7 days.  You have a fever and your symptoms suddenly get worse.  Your child who is 3 months to 35 years old has a temperature of 102.63F (39C) or higher. Get help right away if:  You have difficulty breathing.  You cannot swallow fluids, soft foods, or your saliva.  You have increased swelling in your throat or neck.  You have persistent nausea and vomiting. Summary  A sore throat is pain, burning, irritation, or scratchiness in the throat. Many things can cause a sore throat.  Take over-the-counter medicines only as told by  your health care provider. Do not give your child aspirin.  Drink plenty of fluids, and rest as needed.  Contact a health care provider if your symptoms worsen or your sore throat does not get better within 7 days. This information is not intended to replace advice given to you by your health care provider. Make sure you discuss any questions you have with your health care provider. Document Released: 12/14/2004 Document Revised: 04/08/2018 Document Reviewed: 04/08/2018 Elsevier Interactive Patient Education  2019 [employer].  Upper Respiratory Infection, Adult An upper respiratory infection (URI) affects the nose, throat, and upper air passages. URIs are caused by germs (viruses). The most common type of URI is often called "the common cold." Medicines cannot cure URIs, but you can do things at home to relieve your symptoms. URIs usually get better within 7-10 days. Follow these instructions at home: Activity  Rest as needed.  If you have a fever, stay home from work or school until your fever is gone, or until your doctor says you may return to work or school. ? You should stay home until you cannot spread the infection anymore (you are not contagious). ? Your doctor may have you wear a face mask so you have less risk of spreading the infection. Relieving symptoms  Gargle with a salt-water mixture 3-4 times a day or as needed. To make a salt-water mixture, completely dissolve -1 tsp of salt in 1 cup of warm water.  Use a cool-mist humidifier to add moisture to the air. This can help you breathe more easily. Eating and drinking   Drink enough fluid to keep your pee (urine) pale yellow.  Eat soups and other clear broths. General instructions   Take over-the-counter and prescription medicines only as told by your doctor. These include cold medicines, fever reducers, and cough suppressants.  Do not use any products that contain nicotine or tobacco. These include cigarettes and  e-cigarettes. If you need help quitting, ask your doctor.  Avoid being where people are smoking (avoid secondhand smoke).  Make sure you get regular shots and get the flu shot every year.  Keep all follow-up visits as told by your doctor. This is important. How to avoid spreading infection to others   Wash your hands often with soap and water. If you do not have soap and water, use hand sanitizer.  Avoid touching your mouth, face, eyes, or nose.  Cough or sneeze into a tissue or your sleeve or elbow. Do not cough or sneeze into your hand or into the air. Contact a doctor if:  You are getting worse, not better.  You have any of these: ? A fever. ? Chills. ? Brown or red mucus in your nose. ? Yellow or brown fluid (discharge)coming from your nose. ? Pain in your face, especially when you bend forward. ? Swollen neck glands. ? Pain with swallowing. ? White areas in the back of your throat. Get help right away if:  You have shortness of breath that gets worse.  You have very bad or constant: ? Headache. ? Ear pain. ? Pain in your forehead, behind your eyes, and over your cheekbones (sinus pain). ? Chest pain.  You have long-lasting (chronic) lung disease along with any of these: ? Wheezing. ? Long-lasting cough. ? Coughing up blood. ? A change in your usual mucus.  You have a stiff neck.  You have changes in your: ? Vision. ? Hearing. ? Thinking. ? Mood. Summary  An upper respiratory infection (URI) is caused by a germ called a virus. The most common type of URI is often called "the common cold."  URIs usually get better within 7-10 days.  Take over-the-counter and prescription medicines only as told by your doctor. This information is not intended to replace advice given to you by your health care provider. Make sure you discuss any questions you have with your health care provider. Document Released: 04/24/2008 Document Revised: 06/29/2017 Document Reviewed:  06/29/2017 Elsevier Interactive Patient Education  2019 Elsevier  Inc.     If you have lab work done today you will be contacted with your lab results within the next 2 weeks.  If you have not heard from [place] then please contact us. The fastest way to get your results is to register for My Chart.   IF you received an x-ray today, you will receive an invoice from [facility]. Please contact [facility] at [phone] with questions or concerns regarding your invoice.   IF you received labwork today, you will receive an invoice from [facility]. Please contact [facility] at [phone] with questions or concerns regarding your invoice.   Our billing staff will not be able to assist you with questions regarding bills from these companies.  You will be contacted with the lab results as soon as they are available. The fastest way to get your results is to activate your My Chart account. Instructions are located on the last page of this paperwork. If you have not heard from [place] regarding the results in 2 weeks, please contact this office.       I personally performed the services described in this documentation, which was scribed in my presence. The recorded information has been reviewed and considered for accuracy and completeness, addended by me as needed, and agree with information above.  Signed,   Merri Ray, MD [facility] at [place].  12/10/18 9:13 PM

## 2018-12-09 NOTE — Patient Instructions (Addendum)
Unfortunately symptoms are likely due to a virus.  Strep testing and flu testing was negative/normal today, but I will try to send a throat culture to make sure.  See information below on sore throat.  You can try Cepacol lozenges, and for cough Mucinex is fine.  Make sure to drink plenty of fluids and rest.  Tylenol or ibuprofen if needed for body aches. Thank you for coming in today.   Sore Throat A sore throat is pain, burning, irritation, or scratchiness in the throat. When you have a sore throat, you may feel pain or tenderness in your throat when you swallow or talk. Many things can cause a sore throat, including:  An infection.  Seasonal allergies.  Dryness in the air.  Irritants, such as smoke or pollution.  Radiation treatment to the area.  Gastroesophageal reflux disease (GERD).  A tumor. A sore throat is often the first sign of another sickness. It may happen with other symptoms, such as coughing, sneezing, fever, and swollen neck glands. Most sore throats go away without medical treatment. Follow these instructions at home:      Take over-the-counter medicines only as told by your health care provider. ? If your child has a sore throat, do not give your child aspirin because of the association with Reye syndrome.  Drink enough fluids to keep your urine pale yellow.  Rest as needed.  To help with pain, try: ? Sipping warm liquids, such as broth, herbal tea, or warm water. ? Eating or drinking cold or frozen liquids, such as frozen ice pops. ? Gargling with a salt-water mixture 3-4 times a day or as needed. To make a salt-water mixture, completely dissolve -1 tsp (3-6 g) of salt in 1 cup (237 mL) of warm water. ? Sucking on hard candy or throat lozenges. ? Putting a cool-mist humidifier in your bedroom at night to moisten the air. ? Sitting in the bathroom with the door closed for 5-10 minutes while you run hot water in the shower.  Do not use any products that  contain nicotine or tobacco, such as cigarettes, e-cigarettes, and chewing tobacco. If you need help quitting, ask your health care provider.  Wash your hands well and often with soap and water. If soap and water are not available, use hand sanitizer. Contact a health care provider if:  You have a fever for more than 2-3 days.  You have symptoms that last (are persistent) for more than 2-3 days.  Your throat does not get better within 7 days.  You have a fever and your symptoms suddenly get worse.  Your child who is 3 months to 88 years old has a temperature of 102.68F (39C) or higher. Get help right away if:  You have difficulty breathing.  You cannot swallow fluids, soft foods, or your saliva.  You have increased swelling in your throat or neck.  You have persistent nausea and vomiting. Summary  A sore throat is pain, burning, irritation, or scratchiness in the throat. Many things can cause a sore throat.  Take over-the-counter medicines only as told by your health care provider. Do not give your child aspirin.  Drink plenty of fluids, and rest as needed.  Contact a health care provider if your symptoms worsen or your sore throat does not get better within 7 days. This information is not intended to replace advice given to you by your health care provider. Make sure you discuss any questions you have with your health care provider.  Document Released: 12/14/2004 Document Revised: 04/08/2018 Document Reviewed: 04/08/2018 Elsevier Interactive Patient Education  2019 [employer].  Upper Respiratory Infection, Adult An upper respiratory infection (URI) affects the nose, throat, and upper air passages. URIs are caused by germs (viruses). The most common type of URI is often called "the common cold." Medicines cannot cure URIs, but you can do things at home to relieve your symptoms. URIs usually get better within 7-10 days. Follow these instructions at home: Activity  Rest as  needed.  If you have a fever, stay home from work or school until your fever is gone, or until your doctor says you may return to work or school. ? You should stay home until you cannot spread the infection anymore (you are not contagious). ? Your doctor may have you wear a face mask so you have less risk of spreading the infection. Relieving symptoms  Gargle with a salt-water mixture 3-4 times a day or as needed. To make a salt-water mixture, completely dissolve -1 tsp of salt in 1 cup of warm water.  Use a cool-mist humidifier to add moisture to the air. This can help you breathe more easily. Eating and drinking   Drink enough fluid to keep your pee (urine) pale yellow.  Eat soups and other clear broths. General instructions   Take over-the-counter and prescription medicines only as told by your doctor. These include cold medicines, fever reducers, and cough suppressants.  Do not use any products that contain nicotine or tobacco. These include cigarettes and e-cigarettes. If you need help quitting, ask your doctor.  Avoid being where people are smoking (avoid secondhand smoke).  Make sure you get regular shots and get the flu shot every year.  Keep all follow-up visits as told by your doctor. This is important. How to avoid spreading infection to others   Wash your hands often with soap and water. If you do not have soap and water, use hand sanitizer.  Avoid touching your mouth, face, eyes, or nose.  Cough or sneeze into a tissue or your sleeve or elbow. Do not cough or sneeze into your hand or into the air. Contact a doctor if:  You are getting worse, not better.  You have any of these: ? A fever. ? Chills. ? Brown or red mucus in your nose. ? Yellow or brown fluid (discharge)coming from your nose. ? Pain in your face, especially when you bend forward. ? Swollen neck glands. ? Pain with swallowing. ? White areas in the back of your throat. Get help right away  if:  You have shortness of breath that gets worse.  You have very bad or constant: ? Headache. ? Ear pain. ? Pain in your forehead, behind your eyes, and over your cheekbones (sinus pain). ? Chest pain.  You have long-lasting (chronic) lung disease along with any of these: ? Wheezing. ? Long-lasting cough. ? Coughing up blood. ? A change in your usual mucus.  You have a stiff neck.  You have changes in your: ? Vision. ? Hearing. ? Thinking. ? Mood. Summary  An upper respiratory infection (URI) is caused by a germ called a virus. The most common type of URI is often called "the common cold."  URIs usually get better within 7-10 days.  Take over-the-counter and prescription medicines only as told by your doctor. This information is not intended to replace advice given to you by your health care provider. Make sure you discuss any questions you have with your health  care provider. Document Released: 04/24/2008 Document Revised: 06/29/2017 Document Reviewed: 06/29/2017 Elsevier Interactive Patient Education  [employer].     If you have lab work done today you will be contacted with your lab results within the next 2 weeks.  If you have not heard from [place] then please contact us. The fastest way to get your results is to register for My Chart.   IF you received an x-ray today, you will receive an invoice from [facility]. Please contact [facility] at [phone] with questions or concerns regarding your invoice.   IF you received labwork today, you will receive an invoice from [employer]. Please contact [facility] at [phone] with questions or concerns regarding your invoice.   Our billing staff will not be able to assist you with questions regarding bills from these companies.  You will be contacted with the lab results as soon as they are available. The fastest way to get your results is to activate your My Chart account. Instructions are located  on the last page of this paperwork. If you have not heard from [place] regarding the results in 2 weeks, please contact this office.

## 2018-12-12 LAB — CULTURE, GROUP A STREP: Strep A Culture: POSITIVE — AB

## 2018-12-14 ENCOUNTER — Other Ambulatory Visit: Payer: Self-pay | Admitting: Family Medicine

## 2018-12-14 DIAGNOSIS — J020 Streptococcal pharyngitis: Secondary | ICD-10-CM

## 2018-12-14 MED ORDER — AMOXICILLIN 500 MG PO CAPS: 500.0000 mg | ORAL_CAPSULE | Freq: Three times a day (TID) | ORAL | 0 refills | Status: DC

## 2018-12-14 NOTE — Progress Notes (Signed)
I have called the pt and informed her of message from the provider. She stated understanding.

## 2018-12-14 NOTE — Progress Notes (Signed)
See lab results. positive strep cx.

## 2019-02-03 ENCOUNTER — Ambulatory Visit (INDEPENDENT_AMBULATORY_CARE_PROVIDER_SITE_OTHER): Payer: BLUE CROSS/BLUE SHIELD | Admitting: Family Medicine

## 2019-02-03 ENCOUNTER — Encounter: Payer: Self-pay | Admitting: Family Medicine

## 2019-02-03 ENCOUNTER — Other Ambulatory Visit: Payer: Self-pay

## 2019-02-03 VITALS — BP 138/86 | HR 92 | Temp 98.8°F | Resp 17 | Ht 62.0 in | Wt 163.6 lb

## 2019-02-03 DIAGNOSIS — R6889 Other general symptoms and signs: Secondary | ICD-10-CM

## 2019-02-03 DIAGNOSIS — J069 Acute upper respiratory infection, unspecified: Secondary | ICD-10-CM

## 2019-02-03 LAB — POC INFLUENZA A&B (BINAX/QUICKVUE)
Influenza A, POC: NEGATIVE
Influenza B, POC: NEGATIVE

## 2019-02-03 NOTE — Patient Instructions (Signed)
° ° ° °  If you have lab work done today you will be contacted with your lab results within the next 2 weeks.  If you have not heard from us then please contact us. The fastest way to get your results is to register for My Chart. ° ° °IF you received an x-ray today, you will receive an invoice from [facility]. Please contact [facility] at [phone] with questions or concerns regarding your invoice.  ° °IF you received labwork today, you will receive an invoice from [facility]. Please contact [facility] at [phone] with questions or concerns regarding your invoice.  ° °Our billing staff will not be able to assist you with questions regarding bills from these companies. ° °You will be contacted with the lab results as soon as they are available. The fastest way to get your results is to activate your My Chart account. Instructions are located on the last page of this paperwork. If you have not heard from us regarding the results in 2 weeks, please contact this office. °  ° ° ° °

## 2019-02-03 NOTE — Progress Notes (Signed)
3/16/20205:53 PM  Melanie Frazier September 06, 1971, 48 y.o. female [id number]  Chief Complaint  Patient presents with  . flu like symptoms    st x started thursday and started to hurt more on friday, sneezing bodyaches, chills, ha's and coughing all started by Saturday.  Taking otc alka seltzer cold/flu thurs-sat, started taking great value cold /cough on sunday for the onset of the chest congestion.  Per pt she been off since friday and has been resting, staying hydrated and treating symptoms with no relief    HPI:   Patient is a 48 y.o. female  who presents today for flu like sx  Started 5 days ago with sore throat Coughing, sneezing, really tired, chills Now with chest congestion Has been taking OTC meds Last ibu today No ear pain, nausea, vomiting, diarrhea No known sick contacts Feeling SOB, has not used inhaler Does not smoke Has h/o childhood asthma  Fall Risk  02/03/2019 12/09/2018 10/25/2018 10/14/2018 10/03/2017  Falls in the past year? 0 0 0 0 No  Number falls in past yr: 0 - - - -  Injury with Fall? 0 - - - -  Follow up Falls evaluation completed - - - -     Depression screen [facility] 2/9 02/03/2019 12/09/2018 10/25/2018  Decreased Interest 0 0 0  Down, Depressed, Hopeless 0 0 0  PHQ - 2 Score 0 0 0    No Known Allergies  Prior to Admission medications   Medication Sig Start Date End Date Taking? Authorizing Provider  amoxicillin (AMOXIL) 500 MG capsule Take 1 capsule (500 mg total) by mouth 3 (three) times daily. Patient not taking: Reported on 02/03/2019 12/14/18   Wendie Agreste, MD    Past Medical History:  Diagnosis Date  . Allergy    seasonal  . Anemia   . Asthma    childhood  . GERD (gastroesophageal reflux disease)     Past Surgical History:  Procedure Laterality Date  . infection giving birth      Social History   Tobacco Use  . Smoking status: Never Smoker  . Smokeless tobacco: Never Used  Substance Use Topics  . Alcohol use: No    Family  History  Problem Relation Age of Onset  . Hyperlipidemia Mother   . Hyperlipidemia Father   . Heart disease Father     ROS Per hpi  OBJECTIVE:  Blood pressure 138/86, pulse 92, temperature 98.8 F (37.1 C), temperature source Oral, resp. rate 17, height 5\' 2"  (1.575 m), weight 163 lb 9.6 oz (74.2 kg), last menstrual period 01/17/2019, SpO2 98 %. Body mass index is 29.92 kg/m.   Physical Exam Vitals signs and nursing note reviewed.  Constitutional:      Appearance: She is well-developed.  HENT:     Head: Normocephalic and atraumatic.     Right Ear: Hearing, tympanic membrane, ear canal and external ear normal.     Left Ear: Hearing, tympanic membrane, ear canal and external ear normal.  Eyes:     Conjunctiva/sclera: Conjunctivae normal.     Pupils: Pupils are equal, round, and reactive to light.  Neck:     Musculoskeletal: Neck supple.  Cardiovascular:     Rate and Rhythm: Normal rate and regular rhythm.     Heart sounds: Normal heart sounds. No murmur. No friction rub. No gallop.   Pulmonary:     Effort: Pulmonary effort is normal.     Breath sounds: Normal breath sounds. No wheezing or rales.  Lymphadenopathy:  Cervical: No cervical adenopathy.  Skin:    General: Skin is warm and dry.  Neurological:     Mental Status: She is alert and oriented to person, place, and time.     Results for orders placed or performed in visit on 02/03/19 (from the past 24 hour(s))  POC Influenza A&B(BINAX/QUICKVUE)     Status: None   Collection Time: 02/03/19  5:31 PM  Result Value Ref Range   Influenza A, POC Negative Negative   Influenza B, POC Negative Negative      ASSESSMENT and PLAN  1. URI with cough and congestion Discussed supportive measures for URI: increase hydration, rest, OTC medications, etc. RTC precautions discussed.  2. Flu-like symptoms - POC Influenza A&B(BINAX/QUICKVUE)    Return if symptoms worsen or fail to improve.    Rutherford Guys, MD  Primary Care at [facility] [place], [place] [address] Ph.  [phone] Fax [phone]

## 2019-08-11 DIAGNOSIS — Z01419 Encounter for gynecological examination (general) (routine) without abnormal findings: Secondary | ICD-10-CM | POA: Diagnosis not present

## 2019-09-22 DIAGNOSIS — Z1231 Encounter for screening mammogram for malignant neoplasm of breast: Secondary | ICD-10-CM | POA: Diagnosis not present

## 2020-02-09 DIAGNOSIS — D2272 Melanocytic nevi of left lower limb, including hip: Secondary | ICD-10-CM | POA: Diagnosis not present

## 2020-02-09 DIAGNOSIS — C44519 Basal cell carcinoma of skin of other part of trunk: Secondary | ICD-10-CM | POA: Diagnosis not present

## 2020-02-09 DIAGNOSIS — D2271 Melanocytic nevi of right lower limb, including hip: Secondary | ICD-10-CM | POA: Diagnosis not present

## 2020-02-09 DIAGNOSIS — D2262 Melanocytic nevi of left upper limb, including shoulder: Secondary | ICD-10-CM | POA: Diagnosis not present

## 2020-02-09 DIAGNOSIS — Z85828 Personal history of other malignant neoplasm of skin: Secondary | ICD-10-CM | POA: Diagnosis not present

## 2020-02-09 DIAGNOSIS — C44311 Basal cell carcinoma of skin of nose: Secondary | ICD-10-CM | POA: Diagnosis not present

## 2020-02-14 ENCOUNTER — Ambulatory Visit (HOSPITAL_COMMUNITY)
Admission: EM | Admit: 2020-02-14 | Discharge: 2020-02-14 | Disposition: A | Discharge status: Home / Self Care | Payer: Worker's Compensation | Attending: Urgent Care | Admitting: Urgent Care

## 2020-02-14 ENCOUNTER — Encounter (HOSPITAL_COMMUNITY): Payer: Self-pay

## 2020-02-14 ENCOUNTER — Other Ambulatory Visit: Payer: Self-pay

## 2020-02-14 DIAGNOSIS — M25571 Pain in right ankle and joints of right foot: Secondary | ICD-10-CM

## 2020-02-14 DIAGNOSIS — M7751 Other enthesopathy of right foot: Secondary | ICD-10-CM

## 2020-02-14 MED ORDER — NAPROXEN 500 MG PO TABS: 500.0000 mg | ORAL_TABLET | Freq: Two times a day (BID) | ORAL | 0 refills | Status: DC

## 2020-02-14 NOTE — ED Provider Notes (Signed)
[place]   MRN: [id number] DOB: Sep 07, 1971  Subjective:   Melanie Frazier is a 49 y.o. female presenting for acute onset of right ankle pain that started today.  Patient states that she was simply standing when she felt a sudden sharp pain all around her ankle, started at 10 AM today.  Denies fall, trauma, bruising, swelling, discoloration.  Has taken ibuprofen 200 mg without relief.  Denies history of ankle injury or surgery.  Patient stands and walks for long periods of time for her work.  Has done this kind of work for about 25 years.  She is very active and does well over 10,000 steps daily.  No current facility-administered medications for this encounter.  Current Outpatient Medications:  .  amoxicillin (AMOXIL) 500 MG capsule, Take 1 capsule (500 mg total) by mouth 3 (three) times daily. (Patient not taking: Reported on 02/03/2019), Disp: 30 capsule, Rfl: 0   No Known Allergies  Past Medical History:  Diagnosis Date  . Allergy    seasonal  . Anemia   . Asthma    childhood  . GERD (gastroesophageal reflux disease)      Past Surgical History:  Procedure Laterality Date  . infection giving birth      Family History  Problem Relation Age of Onset  . Hyperlipidemia Mother   . Hyperlipidemia Father   . Heart disease Father     Social History   Tobacco Use  . Smoking status: Never Smoker  . Smokeless tobacco: Never Used  Substance Use Topics  . Alcohol use: No  . Drug use: No    ROS   Objective:   Vitals: BP 114/74   Pulse 79   Temp 98 F (36.7 C) (Oral)   Resp 16   Ht 5\' 3"  (1.6 m)   Wt 160 lb (72.6 kg)   SpO2 98%   BMI 28.34 kg/m   Physical Exam Constitutional:      General: She is not in acute distress.    Appearance: Normal appearance. She is well-developed. She is not ill-appearing, toxic-appearing or diaphoretic.  HENT:     Head: Normocephalic and atraumatic.     Nose: Nose normal.     Mouth/Throat:     Mouth: Mucous membranes are  moist.     Pharynx: Oropharynx is clear.  Eyes:     General: No scleral icterus.    Extraocular Movements: Extraocular movements intact.     Pupils: Pupils are equal, round, and reactive to light.  Cardiovascular:     Rate and Rhythm: Normal rate.  Pulmonary:     Effort: Pulmonary effort is normal.  Musculoskeletal:     Right ankle: No swelling, deformity, ecchymosis or lacerations. Tenderness (worse over either side of her ankle but also anteriorly and over Achilles) present. Decreased range of motion.       Legs:     Comments: 2+ PT and DP pulses.  Skin:    General: Skin is warm and dry.  Neurological:     General: No focal deficit present.     Mental Status: She is alert and oriented to person, place, and time.  Psychiatric:        Mood and Affect: Mood normal.        Behavior: Behavior normal.        Thought Content: Thought content normal.        Judgment: Judgment normal.      Assessment and Plan :   1. Right ankle tendonitis  2. Acute right ankle pain     Suspect inflammatory process related to overuse from her work.  Recommended rice method, naproxen.  Note for work provided to the patient.  Patient had her own Ace wrap, use this to wrap it around her ankle in a figure-of-eight method.  Patient is agreeable to hold off on imaging as there is no trauma, physical exam findings supporting need for it.  We will pursue this if she continues to have symptoms. Counseled patient on potential for adverse effects with medications prescribed/recommended today, ER and return-to-clinic precautions discussed, patient verbalized understanding.    Jaynee Eagles, PA-C 02/14/20 1451

## 2020-02-14 NOTE — ED Triage Notes (Signed)
Pt c/o 10/10 sharp right ankle pain that started at 1000 today. She states she was standing on it and all of a sudden it started having sharp pain. Pt denies injury to ankle. Pt has 2+ right pedal pulse, cap refill less than 3 sec, pt able to wiggle toes, extremity warm to touch, no edema noted.

## 2020-03-08 DIAGNOSIS — C44311 Basal cell carcinoma of skin of nose: Secondary | ICD-10-CM | POA: Diagnosis not present

## 2020-03-12 ENCOUNTER — Ambulatory Visit (INDEPENDENT_AMBULATORY_CARE_PROVIDER_SITE_OTHER): Payer: BC Managed Care – PPO | Admitting: Podiatry

## 2020-03-12 ENCOUNTER — Other Ambulatory Visit: Payer: Self-pay | Admitting: Podiatry

## 2020-03-12 ENCOUNTER — Encounter: Payer: Self-pay | Admitting: Podiatry

## 2020-03-12 ENCOUNTER — Ambulatory Visit (INDEPENDENT_AMBULATORY_CARE_PROVIDER_SITE_OTHER): Payer: BC Managed Care – PPO

## 2020-03-12 ENCOUNTER — Other Ambulatory Visit: Payer: Self-pay

## 2020-03-12 ENCOUNTER — Ambulatory Visit: Payer: BC Managed Care – PPO

## 2020-03-12 DIAGNOSIS — M7751 Other enthesopathy of right foot: Secondary | ICD-10-CM | POA: Diagnosis not present

## 2020-03-12 DIAGNOSIS — M7750 Other enthesopathy of unspecified foot: Secondary | ICD-10-CM

## 2020-03-12 DIAGNOSIS — M79671 Pain in right foot: Secondary | ICD-10-CM | POA: Diagnosis not present

## 2020-03-12 DIAGNOSIS — M76821 Posterior tibial tendinitis, right leg: Secondary | ICD-10-CM | POA: Diagnosis not present

## 2020-03-12 MED ORDER — MELOXICAM 15 MG PO TABS: 15.0000 mg | ORAL_TABLET | Freq: Every day | ORAL | 0 refills | Status: DC

## 2020-03-15 DIAGNOSIS — C44519 Basal cell carcinoma of skin of other part of trunk: Secondary | ICD-10-CM | POA: Diagnosis not present

## 2020-03-22 DIAGNOSIS — Z4802 Encounter for removal of sutures: Secondary | ICD-10-CM | POA: Diagnosis not present

## 2020-04-06 ENCOUNTER — Other Ambulatory Visit: Payer: Self-pay | Admitting: Podiatry

## 2020-04-09 ENCOUNTER — Encounter: Payer: Self-pay | Admitting: Podiatry

## 2020-04-09 ENCOUNTER — Other Ambulatory Visit: Payer: Self-pay

## 2020-04-09 ENCOUNTER — Ambulatory Visit (INDEPENDENT_AMBULATORY_CARE_PROVIDER_SITE_OTHER): Payer: BC Managed Care – PPO | Admitting: Podiatry

## 2020-04-09 DIAGNOSIS — M76821 Posterior tibial tendinitis, right leg: Secondary | ICD-10-CM | POA: Diagnosis not present

## 2020-04-09 DIAGNOSIS — M25571 Pain in right ankle and joints of right foot: Secondary | ICD-10-CM

## 2020-04-09 MED ORDER — METHYLPREDNISOLONE 4 MG PO TBPK
ORAL_TABLET | ORAL | 0 refills | Status: DC
Start: 2020-04-09 — End: 2020-04-16

## 2020-04-09 NOTE — Progress Notes (Signed)
  Subjective:  Patient ID: Melanie Frazier, female    DOB: 09-07-1971,  MRN: [id number]  Chief Complaint  Patient presents with  . Ankle Pain    Pt states right medial ankle pain which began sharp and localized but has not become generalized ankle/foot pain. Duration 3 weeks, no known injuries.     49 y.o. female presents with the above complaint. History confirmed with patient.   Objective:  Physical Exam: warm, good capillary refill, no trophic changes or ulcerative lesions, normal DP and PT pulses and normal sensory exam. Left Foot: normal exam, no swelling, tenderness, instability; ligaments intact, full range of motion of all ankle/foot joints  Right Foot: POP right PT tendon, pes planus noted   No images are attached to the encounter.  Radiographs: X-ray of the right foot: no fracture, dislocation, swelling or degenerative changes noted Assessment:   1. Posterior tibial tendon dysfunction (PTTD) of right lower extremity   2. Posterior tibial tendonitis, right   3. Pain in right foot      Plan:  Patient was evaluated and treated and all questions answered.  Tendonitis -X-rays reviewed as above -Dispensed Tri-Lock ankle brace.  Patient educated on use  -Rx meloxicam  Return in about 4 weeks (around 04/09/2020) for Tendonitis.

## 2020-04-09 NOTE — Progress Notes (Signed)
  Subjective:  Patient ID: Melanie Frazier, female    DOB: [birth year],  MRN: [id number]  Chief Complaint  Patient presents with  . Tendonitis    Right tendonitis follow up. Pt states had improvement for 2 weeks but pain has returned and is the same as before.     49 y.o. female presents with the above complaint. History confirmed with patient.   Objective:  Physical Exam: warm, good capillary refill, no trophic changes or ulcerative lesions, normal DP and PT pulses and normal sensory exam.  Right Foot: POP Medial posterior tibial tendon   Assessment:   1. Posterior tibial tendonitis, right   2. Pain in right ankle and joints of right foot      Plan:  Patient was evaluated and treated and all questions answered.  PT tendonitis -Rx Medrol pack -Continue Trilock ASO brace x2 weeks -Transition into OTC orthotics  Return in about 6 weeks (around 05/21/2020) for Poserior tibial tendonitis.

## 2020-04-16 ENCOUNTER — Ambulatory Visit (HOSPITAL_COMMUNITY)
Admission: EM | Admit: 2020-04-16 | Discharge: 2020-04-16 | Disposition: A | Discharge status: Home / Self Care | Payer: BC Managed Care – PPO | Attending: Family Medicine | Admitting: Family Medicine

## 2020-04-16 ENCOUNTER — Encounter (HOSPITAL_COMMUNITY): Payer: Self-pay

## 2020-04-16 ENCOUNTER — Other Ambulatory Visit: Payer: Self-pay

## 2020-04-16 DIAGNOSIS — K047 Periapical abscess without sinus: Secondary | ICD-10-CM

## 2020-04-16 MED ORDER — HYDROCODONE-ACETAMINOPHEN 5-325 MG PO TABS: 1.0000 | ORAL_TABLET | Freq: Four times a day (QID) | ORAL | 0 refills | Status: DC | PRN

## 2020-04-16 MED ORDER — AMOXICILLIN-POT CLAVULANATE 875-125 MG PO TABS
1.0000 | ORAL_TABLET | Freq: Two times a day (BID) | ORAL | 0 refills | Status: DC
Start: 2020-04-16 — End: 2020-06-18

## 2020-04-16 NOTE — Discharge Instructions (Signed)
Take Augmentin two times a day with food Take a probiotic while you are on Augmentin to prevent stomach upset Take pain medication as needed Do not drive on pain medication See your dentist next week in follow-up

## 2020-04-16 NOTE — ED Provider Notes (Signed)
[name]    CSN: [id number] Arrival date & time: 04/16/20  1214      History   Chief Complaint Chief Complaint  Patient presents with  . Facial Swelling    HPI Melanie Frazier is a 49 y.o. female.   HPI  Patient is here with swelling and pain in her right.  She states it hurts down into her right upper bicuspid region of her teeth.  She states that she has had a lot of dental restorative work done in this area.  She does not have any known fractures or cavities at this time.  She did have Mohs surgery on her nose a couple weeks ago.  This appears to be healing well.  The pain is going into the side of her nose is as is the swelling. She does not have any nasal congestion, postnasal drip, ear pressure or pain or headache.  She has not thought that she had any allergy or sinus symptoms I explained to the patient that facial pain that radiates into the teeth can be from the teeth up into the face, or from the face down into the teeth She states she is otherwise in good health, on no ongoing medications  Past Medical History:  Diagnosis Date  . Allergy    seasonal  . Anemia   . Asthma    childhood  . GERD (gastroesophageal reflux disease)     Patient Active Problem List   Diagnosis Date Noted  . Leukocytosis 07/14/2017  . Viral illness 07/14/2017  . Dehydration 07/14/2017  . SIRS (systemic inflammatory response syndrome) ([place]) 07/13/2017  . Cervical cancer screening 02/21/2016  . Pelvic cramping 02/21/2016  . Screening for HPV (human papillomavirus) 02/21/2016    Past Surgical History:  Procedure Laterality Date  . infection giving birth      OB History   No obstetric history on file.      Home Medications    Prior to Admission medications   Medication Sig Start Date End Date Taking? Authorizing Provider  amoxicillin-clavulanate (AUGMENTIN) 875-125 MG tablet Take 1 tablet by mouth every 12 (twelve) hours. 04/16/20   Raylene Everts, MD    HYDROcodone-acetaminophen (NORCO/VICODIN) 5-325 MG tablet Take 1-2 tablets by mouth every 6 (six) hours as needed. 04/16/20   Raylene Everts, MD  naproxen (NAPROSYN) 500 MG tablet Take 1 tablet (500 mg total) by mouth 2 (two) times daily with a meal. 02/14/20   Jaynee Eagles, PA-C    Family History Family History  Problem Relation Age of Onset  . Hyperlipidemia Mother   . Hyperlipidemia Father   . Heart disease Father     Social History Social History   Tobacco Use  . Smoking status: Never Smoker  . Smokeless tobacco: Never Used  Substance Use Topics  . Alcohol use: No  . Drug use: No     Allergies   Patient has no known allergies.   Review of Systems Review of Systems  HENT: Positive for dental problem.        Pain in teeth, cheek and face     Physical Exam Triage Vital Signs ED Triage Vitals  Enc Vitals Group     BP 04/16/20 1255 (!) 158/86     Pulse Rate 04/16/20 1255 78     Resp 04/16/20 1255 18     Temp 04/16/20 1255 98.7 F (37.1 C)     Temp Source 04/16/20 1255 Oral     SpO2 04/16/20 1255 100 %  Weight --      Height --      Head Circumference --      Peak Flow --      Pain Score 04/16/20 1254 6     Pain Loc --      Pain Edu? --      Excl. in [place]? --    No data found.  Updated Vital Signs BP (!) 158/86 (BP Location: Right Arm)   Pulse 78   Temp 98.7 F (37.1 C) (Oral)   Resp 18   LMP 04/02/2020 (Approximate)   SpO2 100%      Physical Exam Constitutional:      General: She is not in acute distress.    Appearance: She is well-developed and normal weight.     Comments: Appears uncomfortable  HENT:     Head: Normocephalic and atraumatic.      Right Ear: Tympanic membrane and ear canal normal.     Left Ear: Tympanic membrane and ear canal normal.     Nose: Nose normal. No congestion.     Mouth/Throat:     Mouth: Mucous membranes are moist.   Eyes:     Conjunctiva/sclera: Conjunctivae normal.     Pupils: Pupils are equal, round,  and reactive to light.  Cardiovascular:     Rate and Rhythm: Normal rate.  Pulmonary:     Effort: Pulmonary effort is normal. No respiratory distress.  Abdominal:     General: There is no distension.     Palpations: Abdomen is soft.  Musculoskeletal:        General: Normal range of motion.     Cervical back: Normal range of motion.  Lymphadenopathy:     Cervical: No cervical adenopathy.  Skin:    General: Skin is warm and dry.  Neurological:     Mental Status: She is alert.  Psychiatric:        Mood and Affect: Mood normal.        Behavior: Behavior normal.      UC Treatments / Results  Labs (all labs ordered are listed, but only abnormal results are displayed) Labs Reviewed - No data to display  EKG   Radiology No results found.  Procedures Procedures (including critical care time)  Medications Ordered in UC Medications - No data to display  Initial Impression / Assessment and Plan / UC Course  I have reviewed the triage vital signs and the nursing notes.  Pertinent labs & imaging results that were available during my care of the patient were reviewed by me and considered in my medical decision making (see chart for details).     Reviewed that this appears to be a dental abscess on exam.  Recommend she take antibiotics, and see her dentist in follow-up Final Clinical Impressions(s) / UC Diagnoses   Final diagnoses:  Dental abscess     Discharge Instructions     Take Augmentin two times a day with food Take a probiotic while you are on Augmentin to prevent stomach upset Take pain medication as needed Do not drive on pain medication See your dentist next week in follow-up   ED Prescriptions    Medication Sig Dispense Auth. Provider   amoxicillin-clavulanate (AUGMENTIN) 875-125 MG tablet Take 1 tablet by mouth every 12 (twelve) hours. 14 tablet Raylene Everts, MD   HYDROcodone-acetaminophen (NORCO/VICODIN) 5-325 MG tablet Take 1-2 tablets by mouth  every 6 (six) hours as needed. 10 tablet Raylene Everts, MD  I have reviewed the PDMP during this encounter.   Raylene Everts, MD 04/16/20 [phone]

## 2020-04-16 NOTE — ED Triage Notes (Signed)
Pt c/o acute onset facial swelling/pain and tooth pain to upper right upon waking this morning. Reports nausea yesterday. Denies fever, drainage intra-orally, or v/d.   Last dose tylenol approx 1 hour PTA

## 2020-04-17 ENCOUNTER — Encounter (HOSPITAL_COMMUNITY): Payer: Self-pay | Admitting: Emergency Medicine

## 2020-04-17 ENCOUNTER — Ambulatory Visit (HOSPITAL_COMMUNITY)
Admission: EM | Admit: 2020-04-17 | Discharge: 2020-04-17 | Disposition: A | Discharge status: Home / Self Care | Payer: BC Managed Care – PPO | Attending: Family Medicine | Admitting: Family Medicine

## 2020-04-17 DIAGNOSIS — G43809 Other migraine, not intractable, without status migrainosus: Secondary | ICD-10-CM | POA: Diagnosis not present

## 2020-04-17 DIAGNOSIS — K047 Periapical abscess without sinus: Secondary | ICD-10-CM | POA: Diagnosis not present

## 2020-04-17 DIAGNOSIS — R220 Localized swelling, mass and lump, head: Secondary | ICD-10-CM

## 2020-04-17 MED ORDER — KETOROLAC TROMETHAMINE 60 MG/2ML IM SOLN
INTRAMUSCULAR | Status: AC
  Filled 2020-04-17: qty 2

## 2020-04-17 MED ORDER — KETOROLAC TROMETHAMINE 60 MG/2ML IM SOLN
60.0000 mg | Freq: Once | INTRAMUSCULAR | Status: AC
  Administered 2020-04-17: 60 mg via INTRAMUSCULAR

## 2020-04-17 MED ORDER — CLINDAMYCIN HCL 150 MG PO CAPS: 150.0000 mg | ORAL_CAPSULE | Freq: Three times a day (TID) | ORAL | 0 refills | Status: AC

## 2020-04-17 MED ORDER — RIZATRIPTAN BENZOATE 5 MG PO TABS: 5.0000 mg | ORAL_TABLET | ORAL | 0 refills | Status: DC | PRN

## 2020-04-17 NOTE — ED Triage Notes (Signed)
Pt c/o facial swelling getting worse onset yesterday. Pt states she is still having pain and now some pain in right ear.

## 2020-04-17 NOTE — ED Provider Notes (Signed)
[place]   [id number] 04/17/20 Arrival Time: 1005  CC: DENTAL pain  SUBJECTIVE:  Melanie Frazier is a 49 y.o. female who presents with dental pain 3 days. She was seen here yesterday and was prescribed Augmentin and pain medication. She has had one dose of the Augmentin last night. Reports that her face and her upper lip are more swollen this morning. Unsure if she has been allergic to PCN in the past. Is concerned that the swelling may be related to the medication. Denies a precipitating event or trauma. Worse with chewing.  Does see a dentist regularly. Denies fever, chills, dysphagia, odynophagia, oral or neck swelling, nausea, vomiting, chest pain, SOB.    ROS: As per HPI.  All other pertinent ROS negative.     Past Medical History:  Diagnosis Date  . Allergy    seasonal  . Anemia   . Asthma    childhood  . GERD (gastroesophageal reflux disease)    Past Surgical History:  Procedure Laterality Date  . infection giving birth     No Known Allergies No current facility-administered medications on file prior to encounter.   Current Outpatient Medications on File Prior to Encounter  Medication Sig Dispense Refill  . amoxicillin-clavulanate (AUGMENTIN) 875-125 MG tablet Take 1 tablet by mouth every 12 (twelve) hours. 14 tablet 0  . HYDROcodone-acetaminophen (NORCO/VICODIN) 5-325 MG tablet Take 1-2 tablets by mouth every 6 (six) hours as needed. 10 tablet 0  . naproxen (NAPROSYN) 500 MG tablet Take 1 tablet (500 mg total) by mouth 2 (two) times daily with a meal. 30 tablet 0   Social History   Socioeconomic History  . Marital status: Divorced    Spouse name: Not on file  . Number of children: 1  . Years of education: Not on file  . Highest education level: Not on file  Occupational History  . Not on file  Tobacco Use  . Smoking status: Never Smoker  . Smokeless tobacco: Never Used  Substance and Sexual Activity  . Alcohol use: No  . Drug use: No  . Sexual  activity: Yes  Other Topics Concern  . Not on file  Social History Narrative  . Not on file   Social Determinants of Health   Financial Resource Strain:   . Difficulty of Paying Living Expenses:   Food Insecurity:   . Worried About Charity fundraiser in the Last Year:   . Arboriculturist in the Last Year:   Transportation Needs:   . Film/video editor (Medical):   Marland Kitchen Lack of Transportation (Non-Medical):   Physical Activity:   . Days of Exercise per Week:   . Minutes of Exercise per Session:   Stress:   . Feeling of Stress :   Social Connections:   . Frequency of Communication with Friends and Family:   . Frequency of Social Gatherings with Friends and Family:   . Attends Religious Services:   . Active Member of Clubs or Organizations:   . Attends Archivist Meetings:   Marland Kitchen Marital Status:   Intimate Partner Violence:   . Fear of Current or Ex-Partner:   . Emotionally Abused:   Marland Kitchen Physically Abused:   . Sexually Abused:    Family History  Problem Relation Age of Onset  . Hyperlipidemia Mother   . Hyperlipidemia Father   . Heart disease Father     OBJECTIVE:  Vitals:   04/17/20 1026  BP: (!) 114/56  Pulse: 79  Resp: 15  Temp: 98.2 F (36.8 C)  TempSrc: Oral  SpO2: 100%    General appearance: alert; no distress HENT: normocephalic; atraumatic; dentition: fair; swollen and inflamed over right upper gums without areas of fluctuance Neck: supple without LAD Lungs: normal respirations Skin: warm and dry Psychological: alert and cooperative; normal mood and affect  ASSESSMENT & PLAN:  1. Dental abscess   2. Facial swelling   3. Other migraine without status migrainosus, not intractable     Meds ordered this encounter  Medications  . ketorolac (TORADOL) injection 60 mg  . clindamycin (CLEOCIN) 150 MG capsule    Sig: Take 1 capsule (150 mg total) by mouth 3 (three) times daily for 7 days.    Dispense:  21 capsule    Refill:  0    Order  Specific Question:   Supervising Provider    Answer:   Chase Picket [id number]  . rizatriptan (MAXALT) 5 MG tablet    Sig: Take 1 tablet (5 mg total) by mouth as needed for migraine. May repeat in 2 hours if needed    Dispense:  10 tablet    Refill:  0    Order Specific Question:   Supervising Provider    Answer:   Chase Picket [id number]    Migraine Gave Toradol in office today Prescribed Maxalt 5 mg Take as directed Follow up as needed   Facial swelling Dental abscess Prescribed Clindamycin Stop Augmentin Continue with Hydrocodone as needed for pain Recommend soft diet until evaluated by dentist Maintain oral hygiene care Follow up with dentist as soon as possible for further evaluation and treatment  Return or go to the ED if you have any new or worsening symptoms such as fever, chills, difficulty swallowing, painful swallowing, oral or neck swelling, nausea, vomiting, chest pain, SOB.  Reviewed expectations re: course of current medical issues. Questions answered. Outlined signs and symptoms indicating need for more acute intervention. Patient verbalized understanding. After Visit Summary given.    Faustino Congress, NP 04/17/20 1115

## 2020-04-17 NOTE — Discharge Instructions (Addendum)
Pain injection in the office today  Prescribed Maxalt for migraines  Prescribed Clindamycin for abscessed tooth  Stop the Augmentin  Follow up with dentist

## 2020-05-21 ENCOUNTER — Ambulatory Visit (INDEPENDENT_AMBULATORY_CARE_PROVIDER_SITE_OTHER): Payer: BC Managed Care – PPO | Admitting: Podiatry

## 2020-05-21 ENCOUNTER — Encounter: Payer: Self-pay | Admitting: Podiatry

## 2020-05-21 ENCOUNTER — Ambulatory Visit (INDEPENDENT_AMBULATORY_CARE_PROVIDER_SITE_OTHER): Payer: BC Managed Care – PPO

## 2020-05-21 ENCOUNTER — Other Ambulatory Visit: Payer: Self-pay

## 2020-05-21 DIAGNOSIS — M76822 Posterior tibial tendinitis, left leg: Secondary | ICD-10-CM

## 2020-05-21 DIAGNOSIS — M79671 Pain in right foot: Secondary | ICD-10-CM

## 2020-05-21 DIAGNOSIS — M25571 Pain in right ankle and joints of right foot: Secondary | ICD-10-CM

## 2020-05-21 DIAGNOSIS — M76821 Posterior tibial tendinitis, right leg: Secondary | ICD-10-CM
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
## 2020-06-17 ENCOUNTER — Ambulatory Visit (INDEPENDENT_AMBULATORY_CARE_PROVIDER_SITE_OTHER): Payer: BC Managed Care – PPO | Admitting: Orthotics

## 2020-06-17 ENCOUNTER — Other Ambulatory Visit: Payer: Self-pay

## 2020-06-17 DIAGNOSIS — M76822 Posterior tibial tendinitis, left leg: Secondary | ICD-10-CM

## 2020-06-17 DIAGNOSIS — M25571 Pain in right ankle and joints of right foot: Secondary | ICD-10-CM

## 2020-06-17 DIAGNOSIS — M79671 Pain in right foot: Secondary | ICD-10-CM

## 2020-06-17 DIAGNOSIS — M76821 Posterior tibial tendinitis, right leg: Secondary | ICD-10-CM

## 2020-06-17 NOTE — Progress Notes (Signed)
Patient came in today to pick up custom made foot orthotics.  The goals were accomplished and the patient reported no dissatisfaction with said orthotics.  Patient was advised of breakin period and how to report any issues. 

## 2020-06-18 ENCOUNTER — Ambulatory Visit (HOSPITAL_COMMUNITY)
Admission: EM | Admit: 2020-06-18 | Discharge: 2020-06-18 | Disposition: A | Discharge status: Home / Self Care | Payer: BC Managed Care – PPO | Attending: Urgent Care | Admitting: Urgent Care

## 2020-06-18 ENCOUNTER — Encounter (HOSPITAL_COMMUNITY): Payer: Self-pay

## 2020-06-18 ENCOUNTER — Other Ambulatory Visit: Payer: Self-pay

## 2020-06-18 DIAGNOSIS — M7918 Myalgia, other site: Secondary | ICD-10-CM

## 2020-06-18 DIAGNOSIS — R110 Nausea: Secondary | ICD-10-CM | POA: Diagnosis not present

## 2020-06-18 DIAGNOSIS — R197 Diarrhea, unspecified: Secondary | ICD-10-CM

## 2020-06-18 DIAGNOSIS — K529 Noninfective gastroenteritis and colitis, unspecified: Secondary | ICD-10-CM

## 2020-06-18 DIAGNOSIS — R1010 Upper abdominal pain, unspecified: Secondary | ICD-10-CM

## 2020-06-18 MED ORDER — ONDANSETRON 8 MG PO TBDP
8.0000 mg | ORAL_TABLET | Freq: Three times a day (TID) | ORAL | 0 refills | Status: DC | PRN
Start: 2020-06-18 — End: 2021-01-06

## 2020-06-18 MED ORDER — MELOXICAM 7.5 MG PO TABS: 7.5000 mg | ORAL_TABLET | Freq: Every day | ORAL | 0 refills | Status: DC

## 2020-06-18 MED ORDER — LOPERAMIDE HCL 2 MG PO CAPS
2.0000 mg | ORAL_CAPSULE | Freq: Two times a day (BID) | ORAL | 0 refills | Status: DC | PRN
Start: 2020-06-18 — End: 2021-01-06

## 2020-06-18 NOTE — ED Triage Notes (Signed)
Pt presents with epigastric abdominal pain, nausea , and diarrhea for the oast few days; pt has Hx of GERD and ulcers.

## 2020-06-18 NOTE — ED Provider Notes (Signed)
[place]   MRN: [id number] DOB: [birth year]  Subjective:   Melanie Frazier is a 49 y.o. female presenting for several day history of diarrhea, nausea without vomiting.  Patient has developed upper abdominal pain, diarrhea has improved.  She has used some over-the-counter medication that she is to use when she had an ulcer.  Patient states that this was about 20 years ago, resolved with medical therapy.  States that she did eat out recently and subsequently got sick.  Denies fever, vomiting, bloody stools, recent antibiotic use, recent hospitalizations.  Patient also wanted to discuss having right-sided mid buttock pain that radiates up along her back.  No current facility-administered medications for this encounter.  Current Outpatient Medications:  .  HYDROcodone-acetaminophen (NORCO/VICODIN) 5-325 MG tablet, Take 1-2 tablets by mouth every 6 (six) hours as needed., Disp: 10 tablet, Rfl: 0 .  naproxen (NAPROSYN) 500 MG tablet, Take 1 tablet (500 mg total) by mouth 2 (two) times daily with a meal., Disp: 30 tablet, Rfl: 0 .  rizatriptan (MAXALT) 5 MG tablet, Take 1 tablet (5 mg total) by mouth as needed for migraine. May repeat in 2 hours if needed, Disp: 10 tablet, Rfl: 0   No Known Allergies  Past Medical History:  Diagnosis Date  . Allergy    seasonal  . Anemia   . Asthma    childhood  . GERD (gastroesophageal reflux disease)      Past Surgical History:  Procedure Laterality Date  . infection giving birth      Family History  Problem Relation Age of Onset  . Hyperlipidemia Mother   . Hyperlipidemia Father   . Heart disease Father     Social History   Tobacco Use  . Smoking status: Never Smoker  . Smokeless tobacco: Never Used  Vaping Use  . Vaping Use: Never used  Substance Use Topics  . Alcohol use: No  . Drug use: No    ROS   Objective:   Vitals: BP (!) 131/80 (BP Location: Right Arm)   Pulse 71   Temp 98.5 F (36.9 C) (Oral)   Resp 17   LMP  05/25/2020   SpO2 99%   Physical Exam Constitutional:      General: She is not in acute distress.    Appearance: Normal appearance. She is well-developed and normal weight. She is not ill-appearing, toxic-appearing or diaphoretic.  HENT:     Head: Normocephalic and atraumatic.     Right Ear: External ear normal.     Left Ear: External ear normal.     Nose: Nose normal.     Mouth/Throat:     Mouth: Mucous membranes are moist.     Pharynx: Oropharynx is clear.  Eyes:     General: No scleral icterus.    Extraocular Movements: Extraocular movements intact.     Pupils: Pupils are equal, round, and reactive to light.  Cardiovascular:     Rate and Rhythm: Normal rate and regular rhythm.     Heart sounds: Normal heart sounds. No murmur heard.  No friction rub. No gallop.   Pulmonary:     Effort: Pulmonary effort is normal. No respiratory distress.     Breath sounds: Normal breath sounds. No stridor. No wheezing, rhonchi or rales.  Abdominal:     General: Bowel sounds are normal. There is no distension.     Palpations: Abdomen is soft. There is no mass.     Tenderness: There is generalized abdominal tenderness (throughout). There is  no right CVA tenderness, left CVA tenderness, guarding or rebound.  Skin:    General: Skin is warm and dry.     Coloration: Skin is not pale.     Findings: No rash.  Neurological:     General: No focal deficit present.     Mental Status: She is alert and oriented to person, place, and time.     Motor: No weakness.     Coordination: Coordination normal.     Gait: Gait normal.     Deep Tendon Reflexes: Reflexes normal.  Psychiatric:        Mood and Affect: Mood normal.        Behavior: Behavior normal.        Thought Content: Thought content normal.        Judgment: Judgment normal.     Assessment and Plan :   PDMP not reviewed this encounter.  1. Nausea   2. Diarrhea, unspecified type   3. Pain of upper abdomen   4. Gastroenteritis   5.  Right buttock pain     Will manage for suspected viral gastroenteritis with supportive care.  Recommended patient hydrate well, eat light meals and maintain electrolytes.  Will use Zofran and Imodium for nausea, vomiting and diarrhea.  Recommended meloxicam for her musculoskeletal pain of the buttock and low back, upper abdominal pain.  Counseled patient on potential for adverse effects with medications prescribed/recommended today, strict ER and return-to-clinic precautions discussed, patient verbalized understanding.    Jaynee Eagles, [place] 06/18/20 1129

## 2020-07-01 ENCOUNTER — Encounter (HOSPITAL_COMMUNITY): Payer: Self-pay

## 2020-07-01 ENCOUNTER — Emergency Department (HOSPITAL_COMMUNITY)
Admission: EM | Admit: 2020-07-01 | Discharge: 2020-07-01 | Disposition: A | Discharge status: Home / Self Care | Payer: BC Managed Care – PPO | Attending: Emergency Medicine | Admitting: Emergency Medicine

## 2020-07-01 ENCOUNTER — Other Ambulatory Visit: Payer: Self-pay

## 2020-07-01 ENCOUNTER — Emergency Department (HOSPITAL_COMMUNITY): Payer: BC Managed Care – PPO

## 2020-07-01 DIAGNOSIS — R112 Nausea with vomiting, unspecified: Secondary | ICD-10-CM | POA: Insufficient documentation

## 2020-07-01 DIAGNOSIS — J45909 Unspecified asthma, uncomplicated: Secondary | ICD-10-CM | POA: Diagnosis not present

## 2020-07-01 DIAGNOSIS — N83202 Unspecified ovarian cyst, left side: Secondary | ICD-10-CM | POA: Diagnosis not present

## 2020-07-01 DIAGNOSIS — K3189 Other diseases of stomach and duodenum: Secondary | ICD-10-CM | POA: Diagnosis not present

## 2020-07-01 DIAGNOSIS — K279 Peptic ulcer, site unspecified, unspecified as acute or chronic, without hemorrhage or perforation: Secondary | ICD-10-CM | POA: Diagnosis not present

## 2020-07-01 DIAGNOSIS — K219 Gastro-esophageal reflux disease without esophagitis: Secondary | ICD-10-CM | POA: Insufficient documentation

## 2020-07-01 DIAGNOSIS — N83291 Other ovarian cyst, right side: Secondary | ICD-10-CM | POA: Diagnosis not present

## 2020-07-01 DIAGNOSIS — R197 Diarrhea, unspecified: Secondary | ICD-10-CM | POA: Diagnosis not present

## 2020-07-01 DIAGNOSIS — N8301 Follicular cyst of right ovary: Secondary | ICD-10-CM | POA: Diagnosis not present

## 2020-07-01 DIAGNOSIS — R1013 Epigastric pain: Secondary | ICD-10-CM | POA: Diagnosis not present

## 2020-07-01 DIAGNOSIS — K271 Acute peptic ulcer, site unspecified, with perforation: Secondary | ICD-10-CM | POA: Diagnosis not present

## 2020-07-01 LAB — COMPREHENSIVE METABOLIC PANEL
ALT: 21 U/L (ref 0–44)
AST: 23 U/L (ref 15–41)
Albumin: 3.8 g/dL (ref 3.5–5.0)
Alkaline Phosphatase: 61 U/L (ref 38–126)
Anion gap: 11 (ref 5–15)
BUN: 13 mg/dL (ref 6–20)
CO2: 23 mmol/L (ref 22–32)
Calcium: 9.3 mg/dL (ref 8.9–10.3)
Chloride: 105 mmol/L (ref 98–111)
Creatinine, Ser: 0.71 mg/dL (ref 0.44–1.00)
GFR calc Af Amer: >60 mL/min (ref >60)
GFR calc non Af Amer: >60 mL/min (ref >60)
Glucose, Bld: 98 mg/dL (ref 70–99)
Potassium: 3.7 mmol/L (ref 3.5–5.1)
Sodium: 139 mmol/L (ref 135–145)
Total Bilirubin: 0.8 mg/dL (ref 0.3–1.2)
Total Protein: 7.6 g/dL (ref 6.5–8.1)

## 2020-07-01 LAB — URINALYSIS, ROUTINE W REFLEX MICROSCOPIC
Bilirubin Urine: NEGATIVE
Glucose, UA: NEGATIVE mg/dL
Ketones, ur: NEGATIVE mg/dL
Leukocytes,Ua: NEGATIVE
Nitrite: NEGATIVE
Protein, ur: NEGATIVE mg/dL
Specific Gravity, Urine: 1.014 (ref 1.005–1.030)
pH: 6 (ref 5.0–8.0)

## 2020-07-01 LAB — CBC
HCT: 43.4 % (ref 36.0–46.0)
Hemoglobin: 13.6 g/dL (ref 12.0–15.0)
MCH: 25.9 pg — ABNORMAL LOW (ref 26.0–34.0)
MCHC: 31.3 g/dL (ref 30.0–36.0)
MCV: 82.5 fL (ref 80.0–100.0)
Platelets: 392 10*3/uL (ref 150–400)
RBC: 5.26 MIL/uL — ABNORMAL HIGH (ref 3.87–5.11)
RDW: 13.7 % (ref 11.5–15.5)
WBC: 9.5 10*3/uL (ref 4.0–10.5)
nRBC: 0 % (ref 0.0–0.2)

## 2020-07-01 LAB — I-STAT BETA HCG BLOOD, ED (MC, WL, AP ONLY): I-stat hCG, quantitative: 6.9 m[IU]/mL — ABNORMAL HIGH (ref <5)

## 2020-07-01 LAB — LIPASE, BLOOD: Lipase: 35 U/L (ref 11–51)

## 2020-07-01 MED ORDER — SUCRALFATE 1 G PO TABS
1.0000 g | ORAL_TABLET | Freq: Three times a day (TID) | ORAL | 0 refills | Status: DC
Start: 2020-07-01 — End: 2021-01-06

## 2020-07-01 MED ORDER — MORPHINE SULFATE (PF) 4 MG/ML IV SOLN
4.0000 mg | Freq: Once | INTRAVENOUS | Status: AC
  Administered 2020-07-01: 4 mg via INTRAVENOUS
  Filled 2020-07-01: qty 1

## 2020-07-01 MED ORDER — PANTOPRAZOLE SODIUM 20 MG PO TBEC
20.0000 mg | DELAYED_RELEASE_TABLET | Freq: Two times a day (BID) | ORAL | 0 refills | Status: DC
Start: 2020-07-01 — End: 2021-01-06

## 2020-07-01 MED ORDER — ALUM & MAG HYDROXIDE-SIMETH 200-200-20 MG/5ML PO SUSP
30.0000 mL | Freq: Once | ORAL | Status: AC
  Administered 2020-07-01: 30 mL via ORAL
  Filled 2020-07-01: qty 30

## 2020-07-01 MED ORDER — TRAMADOL HCL 50 MG PO TABS: 50.0000 mg | ORAL_TABLET | Freq: Four times a day (QID) | ORAL | 0 refills | Status: DC | PRN

## 2020-07-01 MED ORDER — IOHEXOL 300 MG/ML  SOLN
100.0000 mL | Freq: Once | INTRAMUSCULAR | Status: AC | PRN
  Administered 2020-07-01: 100 mL via INTRAVENOUS

## 2020-07-01 MED ORDER — FAMOTIDINE IN NACL 20-0.9 MG/50ML-% IV SOLN
20.0000 mg | Freq: Once | INTRAVENOUS | Status: AC
  Administered 2020-07-01: 20 mg via INTRAVENOUS
  Filled 2020-07-01: qty 50

## 2020-07-01 NOTE — Discharge Instructions (Signed)
Start Protonix - take twice daily  Start Carafate - take 4 times daily Stop Mobic (Meloxicam) and avoid any other NSAIDs (Ibuprofen, Advil, Naproxen, Aleve) Take Tramadol as needed for severe pain Please make a follow up appointment with Gastroenterology. You may need a repeat endoscopy Return to the ER if you have constant severe pain, uncontrollable vomiting, vomiting blood or blood in the stool

## 2020-07-01 NOTE — ED Triage Notes (Signed)
Patient complains of 2 weeks of epigastric pain with nausea. Patient initially had diarrhea and thought she had food poisoning. Now reports pain worse if she doesn't eat throughout the day

## 2020-07-01 NOTE — ED Provider Notes (Signed)
[facility] DEPART[facility] Provider Note   CSN: [id number] Arrival date & time: 07/01/20  0818     History No chief complaint on file.   Melanie Frazier is a 49 y.o. female who presents with epigastric pain.  Patient states that she initially thought she had food poisoning 2 to 3 weeks ago because she started to have abdominal pain, nausea and diarrhea.  The diarrhea lasted about 1 day and then resolved but she continued to have epigastric pain and nausea.  The pain is constant but waxes and wanes in severity.  Sometimes it will be sharp and severe.  It actually gets better when she eats a little bit but she is to be careful about what she eats.  She likes to drink cold water and that aggravates the pain.  She went to urgent care on 7/30 and was diagnosed with gastroenteritis and was prescribed Zofran, Imodium and Mobic.  She states that the pain feels similar as to when she had an ulcer years ago.  She was seen in the ED in 2014 had a CT scan which was consistent with gastritis versus peptic ulcer disease.  She was referred to GI and states that she had an endoscopy done at [facility] which showed a large ulcer.  She was started on a PPI and her symptoms improved.  She gets pain in all on and off in the area but it is never this bad or persistent.  She had an episode of vomiting last night.  She was up all night due to the pain so decided to come to the ER today.  She denies fever, chills, chest pain, shortness of breath, blood in the vomit or stool.  She states she had she is having regular bowel movements and denies any urinary symptoms.  Denies any prior abdominal surgeries. She tried OTC antacids without relief.  HPI     Past Medical History:  Diagnosis Date  . Allergy    seasonal  . Anemia   . Asthma    childhood  . GERD (gastroesophageal reflux disease)     Patient Active Problem List   Diagnosis Date Noted  . Leukocytosis 07/14/2017  . Viral illness  07/14/2017  . Dehydration 07/14/2017  . SIRS (systemic inflammatory response syndrome) ([place]) 07/13/2017  . Cervical cancer screening 02/21/2016  . Pelvic cramping 02/21/2016  . Screening for HPV (human papillomavirus) 02/21/2016    Past Surgical History:  Procedure Laterality Date  . infection giving birth       OB History   No obstetric history on file.     Family History  Problem Relation Age of Onset  . Hyperlipidemia Mother   . Hyperlipidemia Father   . Heart disease Father     Social History   Tobacco Use  . Smoking status: Never Smoker  . Smokeless tobacco: Never Used  Vaping Use  . Vaping Use: Never used  Substance Use Topics  . Alcohol use: No  . Drug use: No    Home Medications Prior to Admission medications   Medication Sig Start Date End Date Taking? Authorizing Provider  HYDROcodone-acetaminophen (NORCO/VICODIN) 5-325 MG tablet Take 1-2 tablets by mouth every 6 (six) hours as needed. 04/16/20   Raylene Everts, MD  loperamide (IMODIUM) 2 MG capsule Take 1 capsule (2 mg total) by mouth 2 (two) times daily as needed for diarrhea or loose stools. 06/18/20   Jaynee Eagles, PA-C  meloxicam (MOBIC) 7.5 MG tablet Take 1 tablet (  7.5 mg total) by mouth daily. 06/18/20   Jaynee Eagles, PA-C  naproxen (NAPROSYN) 500 MG tablet Take 1 tablet (500 mg total) by mouth 2 (two) times daily with a meal. 02/14/20   Jaynee Eagles, PA-C  ondansetron (ZOFRAN-ODT) 8 MG disintegrating tablet Take 1 tablet (8 mg total) by mouth every 8 (eight) hours as needed for nausea or vomiting. 06/18/20   Jaynee Eagles, PA-C  rizatriptan (MAXALT) 5 MG tablet Take 1 tablet (5 mg total) by mouth as needed for migraine. May repeat in 2 hours if needed 04/17/20   Faustino Congress, NP    Allergies    Patient has no known allergies.  Review of Systems   Review of Systems  Constitutional: Positive for appetite change. Negative for chills and fever.  Respiratory: Negative for shortness of breath.     Cardiovascular: Negative for chest pain.  Gastrointestinal: Positive for abdominal pain, nausea and vomiting. Negative for constipation and diarrhea.  Genitourinary: Negative for dysuria.  All other systems reviewed and are negative.   Physical Exam Updated Vital Signs BP 126/89   Pulse 60   Temp 98.8 F (37.1 C) (Oral)   Resp 16   Ht 5\' 3"  (1.6 m)   Wt 77.1 kg   SpO2 100%   BMI 30.11 kg/m   Physical Exam Vitals and nursing note reviewed.  Constitutional:      General: She is not in acute distress.    Appearance: She is well-developed. She is obese. She is not ill-appearing.     Comments: Calm, cooperative. NAD.  HENT:     Head: Normocephalic and atraumatic.  Eyes:     General: No scleral icterus.       Right eye: No discharge.        Left eye: No discharge.     Conjunctiva/sclera: Conjunctivae normal.     Pupils: Pupils are equal, round, and reactive to light.  Cardiovascular:     Rate and Rhythm: Normal rate and regular rhythm.  Pulmonary:     Effort: Pulmonary effort is normal. No respiratory distress.     Breath sounds: Normal breath sounds.  Abdominal:     General: There is no distension.     Palpations: Abdomen is soft.     Tenderness: There is abdominal tenderness (epigastric, moderate).  Musculoskeletal:     Cervical back: Normal range of motion.  Skin:    General: Skin is warm and dry.  Neurological:     Mental Status: She is alert and oriented to person, place, and time.  Psychiatric:        Behavior: Behavior normal.     ED Results / Procedures / Treatments   Labs (all labs ordered are listed, but only abnormal results are displayed) Labs Reviewed  CBC - Abnormal; Notable for the following components:      Result Value   RBC 5.26 (*)    MCH 25.9 (*)    All other components within normal limits  URINALYSIS, ROUTINE W REFLEX MICROSCOPIC - Abnormal; Notable for the following components:   Hgb urine dipstick MODERATE (*)    Bacteria, UA RARE  (*)    All other components within normal limits  I-STAT BETA HCG BLOOD, ED ([facility], [facility], [facility] ONLY) - Abnormal; Notable for the following components:   I-stat hCG, quantitative 6.9 (*)    All other components within normal limits  LIPASE, BLOOD  COMPREHENSIVE METABOLIC PANEL    EKG None  Radiology CT Abdomen Pelvis W Contrast  Result Date: 07/01/2020 CLINICAL DATA:  Epigastric pain, nausea, diarrhea, food poisoning EXAM: CT ABDOMEN AND PELVIS WITH CONTRAST TECHNIQUE: Multidetector CT imaging of the abdomen and pelvis was performed using the standard protocol following bolus administration of intravenous contrast. CONTRAST:  174mL OMNIPAQUE IOHEXOL 300 MG/ML  SOLN COMPARISON:  10/04/2013 FINDINGS: Lower chest: No acute abnormality. Hepatobiliary: No solid liver abnormality is seen. There is a fluid attenuation lesion of the central right lobe of the liver adjacent to the gallbladder fossa, consistent with a cyst or hemangioma and not significantly changed compared to prior examination no gallstones, gallbladder wall thickening, or biliary dilatation. Pancreas: Unremarkable. No pancreatic ductal dilatation or surrounding inflammatory changes. Spleen: Normal in size without significant abnormality. Adrenals/Urinary Tract: Adrenal glands are unremarkable. Kidneys are normal, without renal calculi, solid lesion, or hydronephrosis. Bladder is unremarkable. Stomach/Bowel: The gastric body and fundus are normal. There is some wall thickening and edema of the gastric antrum, pylorus and duodenal bulb (series 3, image 27). Appendix appears normal. No evidence of bowel wall thickening, distention, or inflammatory changes. Vascular/Lymphatic: No significant vascular findings are present. No enlarged abdominal or pelvic lymph nodes. Reproductive: No mass or other significant abnormality. Fluid attenuation cysts or follicles of the bilateral ovaries. Other: No abdominal wall hernia or abnormality. No abdominopelvic  ascites. Musculoskeletal: No acute or significant osseous findings. IMPRESSION: There is some wall thickening and edema of the gastric antrum, pylorus and duodenal bulb, suggestive of nonspecific infectious or inflammatory gastritis/duodenitis, potentially including peptic ulcer disease. No evidence of overt ulceration or perforation. Electronically Signed   By: Eddie Candle M.D.   On: 07/01/2020 11:25    Procedures Procedures (including critical care time)  Medications Ordered in ED Medications  famotidine (PEPCID) IVPB 20 mg premix (0 mg Intravenous Stopped 07/01/20 1123)  alum & mag hydroxide-simeth (MAALOX/MYLANTA) 200-200-20 MG/5ML suspension 30 mL (30 mLs Oral Given 07/01/20 1010)  morphine 4 MG/ML injection 4 mg (4 mg Intravenous Given 07/01/20 1010)  iohexol (OMNIPAQUE) 300 MG/ML solution 100 mL (100 mLs Intravenous Contrast Given 07/01/20 1110)    ED Course  I have reviewed the triage vital signs and the nursing notes.  Pertinent labs & imaging results that were available during my care of the patient were reviewed by me and considered in my medical decision making (see chart for details).  49 year old female presents with severe epigastric pain for the past several weeks.  She has history of peptic ulcer disease.  She is recently on Mobic for sciatica.  Her vital signs are normal.  She is tender in the epigastrium.  Blood work is overall normal.  We will give GI cocktail, Pepcid, pain control and obtain CT abdomen pelvis  CT shows wall thickening and edema of the gastric antrum, pylorus, duodenal bulb suggestive of gastritis/duodenitis and possibly peptic ulcer disease.  We will start the patient on scheduled PPIs and Carafate and provide short course of pain medicine.  She was advised to follow-up with gastroenterology.  Return precautions discussed.  MDM Rules/Calculators/A&P                           Final Clinical Impression(s) / ED Diagnoses Final diagnoses:  PUD (peptic ulcer  disease)    Rx / DC Orders ED Discharge Orders    None       Recardo Evangelist, PA-C 07/01/20 1154    Isla Pence, MD 07/01/20 [phone]

## 2020-07-01 NOTE — ED Notes (Signed)
Patient verbalizes understanding of discharge instructions. Opportunity for questioning and answers were provided. Armband removed by staff, pt discharged from ED ambulatory.   

## 2020-08-03 DIAGNOSIS — R1013 Epigastric pain: Secondary | ICD-10-CM | POA: Diagnosis not present

## 2020-08-03 DIAGNOSIS — K219 Gastro-esophageal reflux disease without esophagitis: Secondary | ICD-10-CM | POA: Diagnosis not present

## 2020-08-03 DIAGNOSIS — Z1211 Encounter for screening for malignant neoplasm of colon: Secondary | ICD-10-CM | POA: Diagnosis not present

## 2020-08-03 DIAGNOSIS — R933 Abnormal findings on diagnostic imaging of other parts of digestive tract: Secondary | ICD-10-CM | POA: Diagnosis not present

## 2021-01-06 ENCOUNTER — Emergency Department (HOSPITAL_COMMUNITY): Payer: Self-pay

## 2021-01-06 ENCOUNTER — Other Ambulatory Visit: Payer: Self-pay

## 2021-01-06 ENCOUNTER — Emergency Department (HOSPITAL_COMMUNITY)
Admission: EM | Admit: 2021-01-06 | Discharge: 2021-01-06 | Disposition: A | Discharge status: Home / Self Care | Payer: Self-pay | Attending: Emergency Medicine | Admitting: Emergency Medicine

## 2021-01-06 ENCOUNTER — Encounter (HOSPITAL_COMMUNITY): Payer: Self-pay

## 2021-01-06 DIAGNOSIS — R110 Nausea: Secondary | ICD-10-CM | POA: Insufficient documentation

## 2021-01-06 DIAGNOSIS — J45909 Unspecified asthma, uncomplicated: Secondary | ICD-10-CM | POA: Insufficient documentation

## 2021-01-06 DIAGNOSIS — R1013 Epigastric pain: Secondary | ICD-10-CM | POA: Insufficient documentation

## 2021-01-06 DIAGNOSIS — K219 Gastro-esophageal reflux disease without esophagitis: Secondary | ICD-10-CM | POA: Insufficient documentation

## 2021-01-06 LAB — COMPREHENSIVE METABOLIC PANEL
ALT: 23 U/L (ref 0–44)
AST: 23 U/L (ref 15–41)
Albumin: 4.2 g/dL (ref 3.5–5.0)
Alkaline Phosphatase: 71 U/L (ref 38–126)
Anion gap: 10 (ref 5–15)
BUN: 16 mg/dL (ref 6–20)
CO2: 23 mmol/L (ref 22–32)
Calcium: 9 mg/dL (ref 8.9–10.3)
Chloride: 105 mmol/L (ref 98–111)
Creatinine, Ser: 0.5 mg/dL (ref 0.44–1.00)
GFR, Estimated: >60 mL/min (ref >60)
Glucose, Bld: 94 mg/dL (ref 70–99)
Potassium: 3.8 mmol/L (ref 3.5–5.1)
Sodium: 138 mmol/L (ref 135–145)
Total Bilirubin: 0.6 mg/dL (ref 0.3–1.2)
Total Protein: 8 g/dL (ref 6.5–8.1)

## 2021-01-06 LAB — CBC WITH DIFFERENTIAL/PLATELET
Abs Immature Granulocytes: 0.03 10*3/uL (ref 0.00–0.07)
Basophils Absolute: 0.1 10*3/uL (ref 0.0–0.1)
Basophils Relative: 1 %
Eosinophils Absolute: 0.3 10*3/uL (ref 0.0–0.5)
Eosinophils Relative: 4 %
HCT: 41.4 % (ref 36.0–46.0)
Hemoglobin: 13.2 g/dL (ref 12.0–15.0)
Immature Granulocytes: 0 %
Lymphocytes Relative: 30 %
Lymphs Abs: 2.8 10*3/uL (ref 0.7–4.0)
MCH: 26.5 pg (ref 26.0–34.0)
MCHC: 31.9 g/dL (ref 30.0–36.0)
MCV: 83 fL (ref 80.0–100.0)
Monocytes Absolute: 0.6 10*3/uL (ref 0.1–1.0)
Monocytes Relative: 7 %
Neutro Abs: 5.4 10*3/uL (ref 1.7–7.7)
Neutrophils Relative %: 58 %
Platelets: 374 10*3/uL (ref 150–400)
RBC: 4.99 MIL/uL (ref 3.87–5.11)
RDW: 13.4 % (ref 11.5–15.5)
WBC: 9.2 10*3/uL (ref 4.0–10.5)
nRBC: 0 % (ref 0.0–0.2)

## 2021-01-06 LAB — LIPASE, BLOOD: Lipase: 37 U/L (ref 11–51)

## 2021-01-06 MED ORDER — FAMOTIDINE 20 MG PO TABS: 20.0000 mg | ORAL_TABLET | Freq: Two times a day (BID) | ORAL | 0 refills | Status: DC

## 2021-01-06 MED ORDER — FAMOTIDINE 20 MG PO TABS
20.0000 mg | ORAL_TABLET | Freq: Two times a day (BID) | ORAL | 0 refills | Status: DC
Start: 2021-01-06 — End: 2021-01-06

## 2021-01-06 MED ORDER — ONDANSETRON HCL 4 MG/2ML IJ SOLN
4.0000 mg | Freq: Once | INTRAMUSCULAR | Status: AC
  Administered 2021-01-06: 4 mg via INTRAVENOUS
  Filled 2021-01-06: qty 2

## 2021-01-06 MED ORDER — SUCRALFATE 1 GM/10ML PO SUSP
1.0000 g | Freq: Three times a day (TID) | ORAL | 1 refills | Status: DC
Start: 2021-01-06 — End: 2022-11-23

## 2021-01-06 MED ORDER — OXYCODONE HCL 5 MG PO TABS: 5.0000 mg | ORAL_TABLET | Freq: Four times a day (QID) | ORAL | 0 refills | Status: DC | PRN

## 2021-01-06 MED ORDER — ALUM & MAG HYDROXIDE-SIMETH 200-200-20 MG/5ML PO SUSP
30.0000 mL | Freq: Once | ORAL | Status: AC
  Administered 2021-01-06: 30 mL via ORAL
  Filled 2021-01-06: qty 30

## 2021-01-06 MED ORDER — OXYCODONE-ACETAMINOPHEN 5-325 MG PO TABS
1.0000 | ORAL_TABLET | Freq: Once | ORAL | Status: DC
  Filled 2021-01-06: qty 1

## 2021-01-06 MED ORDER — PANTOPRAZOLE SODIUM 20 MG PO TBEC
20.0000 mg | DELAYED_RELEASE_TABLET | Freq: Every day | ORAL | 0 refills | Status: DC
Start: 2021-01-06 — End: 2022-11-23

## 2021-01-06 MED ORDER — SUCRALFATE 1 GM/10ML PO SUSP
1.0000 g | Freq: Three times a day (TID) | ORAL | 1 refills | Status: DC
Start: 2021-01-06 — End: 2021-01-06

## 2021-01-06 MED ORDER — LIDOCAINE VISCOUS HCL 2 % MT SOLN
15.0000 mL | Freq: Once | OROMUCOSAL | Status: AC
  Administered 2021-01-06: 15 mL via ORAL
  Filled 2021-01-06: qty 15

## 2021-01-06 MED ORDER — MORPHINE SULFATE (PF) 4 MG/ML IV SOLN
4.0000 mg | Freq: Once | INTRAVENOUS | Status: AC
  Administered 2021-01-06: 4 mg via INTRAVENOUS
  Filled 2021-01-06: qty 1

## 2021-01-06 MED ORDER — PANTOPRAZOLE SODIUM 20 MG PO TBEC
20.0000 mg | DELAYED_RELEASE_TABLET | Freq: Every day | ORAL | 0 refills | Status: DC
Start: 2021-01-06 — End: 2021-01-06

## 2021-01-06 NOTE — Discharge Instructions (Signed)
Please read and follow all provided instructions.  Your diagnoses today include:  1. Epigastric pain     Tests performed today include:  Blood cell counts and platelets  Kidney and liver function tests  Pancreas function test (called lipase)  Urine test to look for infection  A blood or urine test for pregnancy (women only)  Ultrasound -shows some sludge in the gallbladder, otherwise no concerns  Vital signs. See below for your results today.   Medications prescribed:   Protonix - stomach acid reducer  This medication can be found over-the-counter   Pepcid (famotidine) - antihistamine  You can find this medication over-the-counter.   DO NOT exceed:   20mg  Pepcid every 12 hours   Carafate - for stomach upset and to protect your stomach   Oxycodone - narcotic pain medication  DO NOT drive or perform any activities that require you to be awake and alert because this medicine can make you drowsy.   Take any prescribed medications only as directed.  Home care instructions:   Follow any educational materials contained in this packet.  Follow-up instructions: Please follow-up with your primary care provider in the next 5 days for further evaluation of your symptoms.    Return instructions:  SEEK IMMEDIATE MEDICAL ATTENTION IF:  The pain does not go away or becomes severe   A temperature above 101F develops   Repeated vomiting occurs (multiple episodes)   The pain becomes localized to portions of the abdomen. The right side could possibly be appendicitis. In an adult, the left lower portion of the abdomen could be colitis or diverticulitis.   Blood is being passed in stools or vomit (bright red or black tarry stools)   You develop chest pain, difficulty breathing, dizziness or fainting, or become confused, poorly responsive, or inconsolable (young children)  If you have any other emergent concerns regarding your health  Additional Information: Abdominal  (belly) pain can be caused by many things. Your caregiver performed an examination and possibly ordered blood/urine tests and imaging (CT scan, x-rays, ultrasound). Many cases can be observed and treated at home after initial evaluation in the emergency department. Even though you are being discharged home, abdominal pain can be unpredictable. Therefore, you need a repeated exam if your pain does not resolve, returns, or worsens. Most patients with abdominal pain don't have to be admitted to the hospital or have surgery, but serious problems like appendicitis and gallbladder attacks can start out as nonspecific pain. Many abdominal conditions cannot be diagnosed in one visit, so follow-up evaluations are very important.  Your vital signs today were: BP 137/74   Pulse 63   Temp 98.4 F (36.9 C) (Oral)   Resp 18   Ht 5\' 3"  (1.6 m)   Wt 77.1 kg   SpO2 100%   BMI 30.11 kg/m  If your blood pressure (bp) was elevated above 135/85 this visit, please have this repeated by your doctor within one month. --------------

## 2021-01-06 NOTE — ED Provider Notes (Signed)
[facility] DEPT Provider Note   CSN: [id number] Arrival date & time: 01/06/21  1045     History Chief Complaint  Patient presents with  . Abdominal Pain    Melanie Frazier is a 50 y.o. female.  Patient with history of peptic ulcer disease, no previous abdominal surgeries presents the emergency department today for evaluation of epigastric abdominal pain.  Symptoms have been intermittent over the past for 5 days but severe in the past day.  Patient has had associated nausea but no vomiting.  No stool changes including melena or hematochezia.  Pain does not radiate.  She states that she gets temporary relief after eating but then the pain returns and she feels like she needs to eat more.  She took a Pepcid prior to arrival and has been taking some natural medications that have not helped.  She is not currently on any medications for her stomach.  She states that she does not currently have a gastroenterologist.  She has never had a colonoscopy and has had a remote endoscopy, but none recently.        Past Medical History:  Diagnosis Date  . Allergy    seasonal  . Anemia   . Asthma    childhood  . GERD (gastroesophageal reflux disease)     Patient Active Problem List   Diagnosis Date Noted  . Leukocytosis 07/14/2017  . Viral illness 07/14/2017  . Dehydration 07/14/2017  . SIRS (systemic inflammatory response syndrome) ([place]) 07/13/2017  . Cervical cancer screening 02/21/2016  . Pelvic cramping 02/21/2016  . Screening for HPV (human papillomavirus) 02/21/2016    Past Surgical History:  Procedure Laterality Date  . infection giving birth       OB History   No obstetric history on file.     Family History  Problem Relation Age of Onset  . Hyperlipidemia Mother   . Hyperlipidemia Father   . Heart disease Father     Social History   Tobacco Use  . Smoking status: Never Smoker  . Smokeless tobacco: Never Used  Vaping Use  . Vaping  Use: Never used  Substance Use Topics  . Alcohol use: No  . Drug use: No    Home Medications Prior to Admission medications   Medication Sig Start Date End Date Taking? Authorizing Provider  loperamide (IMODIUM) 2 MG capsule Take 1 capsule (2 mg total) by mouth 2 (two) times daily as needed for diarrhea or loose stools. 06/18/20   Jaynee Eagles, PA-C  meloxicam (MOBIC) 7.5 MG tablet Take 1 tablet (7.5 mg total) by mouth daily. 06/18/20   Jaynee Eagles, PA-C  ondansetron (ZOFRAN-ODT) 8 MG disintegrating tablet Take 1 tablet (8 mg total) by mouth every 8 (eight) hours as needed for nausea or vomiting. 06/18/20   Jaynee Eagles, PA-C  pantoprazole (PROTONIX) 20 MG tablet Take 1 tablet (20 mg total) by mouth 2 (two) times daily before a meal. 07/01/20   Ruthann Cancer, Jesse Fall, PA-C  rizatriptan (MAXALT) 5 MG tablet Take 1 tablet (5 mg total) by mouth as needed for migraine. May repeat in 2 hours if needed 04/17/20   Faustino Congress, NP  sucralfate (CARAFATE) 1 g tablet Take 1 tablet (1 g total) by mouth 4 (four) times daily -  with meals and at bedtime. 07/01/20   Recardo Evangelist, PA-C  traMADol (ULTRAM) 50 MG tablet Take 1 tablet (50 mg total) by mouth every 6 (six) hours as needed. 07/01/20   Recardo Evangelist,  PA-C    Allergies    Patient has no known allergies.  Review of Systems   Review of Systems  Constitutional: Negative for fever.  HENT: Negative for rhinorrhea and sore throat.   Eyes: Negative for redness.  Respiratory: Negative for cough.   Cardiovascular: Negative for chest pain.  Gastrointestinal: Positive for abdominal pain and nausea. Negative for diarrhea and vomiting.  Genitourinary: Negative for dysuria, frequency, hematuria and urgency.  Musculoskeletal: Negative for myalgias.  Skin: Negative for rash.  Neurological: Negative for headaches.    Physical Exam Updated Vital Signs BP (!) 135/94 (BP Location: Left Arm)   Pulse 61   Temp 98.4 F (36.9 C) (Oral)   Resp 18   Ht  5\' 3"  (1.6 m)   Wt 77.1 kg   SpO2 100%   BMI 30.11 kg/m   Physical Exam Vitals and nursing note reviewed.  Constitutional:      General: She is not in acute distress.    Appearance: She is well-developed.  HENT:     Head: Normocephalic and atraumatic.     Right Ear: External ear normal.     Left Ear: External ear normal.     Nose: Nose normal.  Eyes:     Conjunctiva/sclera: Conjunctivae normal.  Cardiovascular:     Rate and Rhythm: Normal rate and regular rhythm.     Heart sounds: No murmur heard.     Comments: Heart sounds normal rate and rhythm, no murmurs or other abnormal sounds. Pulmonary:     Effort: No respiratory distress.     Breath sounds: No wheezing, rhonchi or rales.     Comments: Lungs clear to auscultation bilaterally. Abdominal:     Palpations: Abdomen is soft.     Tenderness: There is abdominal tenderness (mild-moderate) in the epigastric area. There is no guarding or rebound. Negative signs include Murphy's sign.  Musculoskeletal:     Cervical back: Normal range of motion and neck supple.     Right lower leg: No edema.     Left lower leg: No edema.  Skin:    General: Skin is warm and dry.     Findings: No rash.  Neurological:     General: No focal deficit present.     Mental Status: She is alert. Mental status is at baseline.     Motor: No weakness.  Psychiatric:        Mood and Affect: Mood normal.     ED Results / Procedures / Treatments   Labs (all labs ordered are listed, but only abnormal results are displayed) Labs Reviewed  CBC WITH DIFFERENTIAL/PLATELET  COMPREHENSIVE METABOLIC PANEL  LIPASE, BLOOD    ED ECG REPORT   Date: 01/06/2021  Rate: 60  Rhythm: normal sinus rhythm  QRS Axis: normal  Intervals: normal  ST/T Wave abnormalities: normal  Conduction Disutrbances:none  Narrative Interpretation:   Old EKG Reviewed: unchanged from 2019 except improved t-wave V2  I have personally reviewed the EKG tracing and agree with the  computerized printout as noted.  Radiology US Abdomen Limited RUQ (LIVER/GB)  Result Date: 01/06/2021 CLINICAL DATA:  Epigastric abdominal pain. EXAM: ULTRASOUND ABDOMEN LIMITED RIGHT UPPER QUADRANT COMPARISON:  July 01, 2020.  August 20, 2012. FINDINGS: Gallbladder: No gallstones or wall thickening visualized. No sonographic Murphy sign noted by sonographer. Small amount of sludge is noted within gallbladder lumen. Common bile duct: Diameter: 4 mm which is within normal limits. Liver: 2.7 cm cyst is noted adjacent to gallbladder fossa in right  hepatic lobe. Within normal limits in parenchymal echogenicity. Portal vein is patent on color Doppler imaging with normal direction of blood flow towards the liver. Other: None. IMPRESSION: Right hepatic lobe cyst. Small amount of sludge seen within gallbladder lumen. No other abnormality seen in the right upper quadrant of the abdomen. Electronically Signed   By: Marijo Conception M.D.   On: 01/06/2021 12:24    Procedures Procedures   Medications Ordered in ED Medications  oxyCODONE-acetaminophen (PERCOCET/ROXICET) 5-325 MG per tablet 1 tablet (has no administration in time range)  alum & mag hydroxide-simeth (MAALOX/MYLANTA) 200-200-20 MG/5ML suspension 30 mL (30 mLs Oral Given 01/06/21 1128)    And  lidocaine (XYLOCAINE) 2 % viscous mouth solution 15 mL (15 mLs Oral Given 01/06/21 1128)  morphine 4 MG/ML injection 4 mg (4 mg Intravenous Given 01/06/21 1123)  ondansetron (ZOFRAN) injection 4 mg (4 mg Intravenous Given 01/06/21 1123)    ED Course  I have reviewed the triage vital signs and the nursing notes.  Pertinent labs & imaging results that were available during my care of the patient were reviewed by me and considered in my medical decision making (see chart for details).  Patient seen and examined. Work-up initiated. Medications ordered.  Suspect gastritis/PUD.  Will check EKG given upper abdominal/lower chest pain.  Will check right upper  quadrant ultrasound to evaluate for gallbladder etiology.  Will control symptoms with oral and parenteral medication.  Vital signs reviewed and are as follows: BP (!) 135/94 (BP Location: Left Arm)   Pulse 61   Temp 98.4 F (36.9 C) (Oral)   Resp 18   Ht 5\' 3"  (1.6 m)   Wt 77.1 kg   SpO2 100%   BMI 30.11 kg/m   12:58 PM patient's pain improved but not resolved.  Additional oral Percocet given prior to discharge.  All results reviewed with patient at bedside.  Discussed gallbladder sludge, however this is felt less likely be causing her symptoms and gastritis or PUD at this point.  Plan to discharge to home on Protonix, Pepcid, Carafate.  Also given #5 oxycodone to use in case of more severe pain.   Encouraged GI follow-up.  The patient was urged to return to the Emergency Department immediately with worsening of current symptoms, worsening abdominal pain, persistent vomiting, blood noted in stools, fever, or any other concerns. The patient verbalized understanding.      MDM Rules/Calculators/A&P                          Patient with abdominal pain, history of gastritis/PUD and symptoms are suggestive of recurrent. Vitals are stable, no fever. Labs reassuring. Imaging mild gallbladder sludge otherwise no gallstones or other problems with the gallbladder. No signs of dehydration, patient is tolerating PO's. Lungs are clear and no signs suggestive of PNA. Low concern for appendicitis, cholecystitis, pancreatitis, ruptured viscus, UTI, kidney stone, aortic dissection, aortic aneurysm or other emergent abdominal etiology. Supportive therapy indicated with return if symptoms worsen.   Final Clinical Impression(s) / ED Diagnoses Final diagnoses:  Epigastric pain    Rx / DC Orders ED Discharge Orders         Ordered    pantoprazole (PROTONIX) 20 MG tablet  Daily,   Status:  Discontinued        01/06/21 1251    famotidine (PEPCID) 20 MG tablet  2 times daily,   Status:  Discontinued         01/06/21  1251    sucralfate (CARAFATE) 1 GM/10ML suspension  3 times daily with meals & bedtime,   Status:  Discontinued        01/06/21 1251    oxyCODONE (OXY IR/ROXICODONE) 5 MG immediate release tablet  Every 6 hours PRN,   Status:  Discontinued        01/06/21 1251    oxyCODONE (OXY IR/ROXICODONE) 5 MG immediate release tablet  Every 6 hours PRN        01/06/21 1252    famotidine (PEPCID) 20 MG tablet  2 times daily        01/06/21 1254    pantoprazole (PROTONIX) 20 MG tablet  Daily        01/06/21 1254    sucralfate (CARAFATE) 1 GM/10ML suspension  3 times daily with meals & bedtime        01/06/21 1254           Carlisle Cater, PA-C 01/06/21 1300    Gareth Morgan, MD 01/07/21 [id number]

## 2021-01-06 NOTE — ED Triage Notes (Signed)
Pt presents with c/o upper mid abdominal pain for 4-5 days. Pt reports dry heaving, no vomiting.

## 2021-04-07 ENCOUNTER — Telehealth (HOSPITAL_COMMUNITY): Payer: Self-pay | Admitting: Internal Medicine

## 2021-04-07 ENCOUNTER — Encounter (HOSPITAL_COMMUNITY): Payer: Self-pay

## 2021-04-07 ENCOUNTER — Ambulatory Visit (HOSPITAL_COMMUNITY)
Admission: EM | Admit: 2021-04-07 | Discharge: 2021-04-07 | Disposition: A | Discharge status: Home / Self Care | Payer: Self-pay | Attending: Internal Medicine | Admitting: Internal Medicine

## 2021-04-07 ENCOUNTER — Other Ambulatory Visit: Payer: Self-pay

## 2021-04-07 DIAGNOSIS — R21 Rash and other nonspecific skin eruption: Secondary | ICD-10-CM

## 2021-04-07 MED ORDER — PERMETHRIN 0.5 % AERO
INHALATION_SPRAY | Freq: Once | 0 refills | Status: DC
Start: 2021-04-07 — End: 2021-04-07

## 2021-04-07 MED ORDER — PERMETHRIN 5 % EX CREA: TOPICAL_CREAM | CUTANEOUS | 0 refills | Status: DC

## 2021-04-07 NOTE — ED Provider Notes (Signed)
[place]    CSN: [id number] Arrival date & time: 04/07/21  1512      History   Chief Complaint Chief Complaint  Patient presents with  . Insect Bite  . Rash    HPI Melanie Frazier is a 50 y.o. female presents to urgent care with complaints of bug bites.  Patient states she is noticed small, pinpoint size brownish bugs around her bathtub.  Since that time she has noticed bites over her body with severe pruritus.  Patient states she has tried oatmeal baths, rubbing alcohol, Benadryl and hydrocortisone cream with little relief.  She denies any recent fever or chills.  Daughter with similar but much milder symptoms.    Past Medical History:  Diagnosis Date  . Allergy    seasonal  . Anemia   . Asthma    childhood  . GERD (gastroesophageal reflux disease)     Patient Active Problem List   Diagnosis Date Noted  . Leukocytosis 07/14/2017  . Viral illness 07/14/2017  . Dehydration 07/14/2017  . SIRS (systemic inflammatory response syndrome) ([place]) 07/13/2017  . Cervical cancer screening 02/21/2016  . Pelvic cramping 02/21/2016  . Screening for HPV (human papillomavirus) 02/21/2016    Past Surgical History:  Procedure Laterality Date  . infection giving birth      OB History   No obstetric history on file.      Home Medications    Prior to Admission medications   Medication Sig Start Date End Date Taking? Authorizing Provider  famotidine (PEPCID) 20 MG tablet Take 1 tablet (20 mg total) by mouth 2 (two) times daily. 01/06/21   Carlisle Cater, PA-C  oxyCODONE (OXY IR/ROXICODONE) 5 MG immediate release tablet Take 1 tablet (5 mg total) by mouth every 6 (six) hours as needed for severe pain. 01/06/21   Carlisle Cater, PA-C  pantoprazole (PROTONIX) 20 MG tablet Take 1 tablet (20 mg total) by mouth daily. 01/06/21   Carlisle Cater, PA-C  permethrin (ELIMITE) 5 % cream Apply to affected area once 04/07/21   Rudolpho Sevin, NP  sucralfate (CARAFATE) 1 GM/10ML  suspension Take 10 mLs (1 g total) by mouth 4 (four) times daily -  with meals and at bedtime. 01/06/21   Carlisle Cater, PA-C    Family History Family History  Problem Relation Age of Onset  . Hyperlipidemia Mother   . Hyperlipidemia Father   . Heart disease Father     Social History Social History   Tobacco Use  . Smoking status: Never Smoker  . Smokeless tobacco: Never Used  Vaping Use  . Vaping Use: Never used  Substance Use Topics  . Alcohol use: No  . Drug use: No     Allergies   Patient has no known allergies.   Review of Systems As stated in HPI otherwise negative   Physical Exam Triage Vital Signs ED Triage Vitals  Enc Vitals Group     BP 04/07/21 1623 116/79     Pulse Rate 04/07/21 1623 98     Resp 04/07/21 1623 17     Temp 04/07/21 1623 98.6 F (37 C)     Temp src --      SpO2 04/07/21 1626 98 %     Weight --      Height --      Head Circumference --      Peak Flow --      Pain Score 04/07/21 1622 7     Pain Loc --  Pain Edu? --      Excl. in [place]? --    No data found.  Updated Vital Signs BP 116/79   Pulse 80   Temp 98.6 F (37 C)   Resp 17   SpO2 98%   Visual Acuity Right Eye Distance:   Left Eye Distance:   Bilateral Distance:    Right Eye Near:   Left Eye Near:    Bilateral Near:     Physical Exam Constitutional:      General: She is not in acute distress.    Appearance: Normal appearance. She is not ill-appearing or toxic-appearing.  Skin:    General: Skin is warm and dry.     Comments: Multiple erythematous lesions over trunk and chest most notable on upper thighs near inguinal region.  No draining, some mild scaling  Neurological:     Mental Status: She is alert.      UC Treatments / Results  Labs (all labs ordered are listed, but only abnormal results are displayed) Labs Reviewed - No data to display  EKG   Radiology No results found.  Procedures Procedures (including critical care  time)  Medications Ordered in UC Medications - No data to display  Initial Impression / Assessment and Plan / UC Course  I have reviewed the triage vital signs and the nursing notes.  Pertinent labs & imaging results that were available during my care of the patient were reviewed by me and considered in my medical decision making (see chart for details).  Insect bites -Exam and history most consistent with some type of mite.  Consider scabies -Permethrin cream -Environmental measures including laundering sheets and cleaning of rooms -Return for any persistent or worsening symptoms  Reviewed expections re: course of current medical issues. Questions answered. Outlined signs and symptoms indicating need for more acute intervention. Pt verbalized understanding. AVS given  Final Clinical Impressions(s) / UC Diagnoses   Final diagnoses:  Rash and nonspecific skin eruption     Discharge Instructions     Use permethrin as prescribed.  Again can repeat in 14 days if symptoms persist.  You will likely need to call pest control to take care of issue.  Please return for any worsening or persistent symptoms    ED Prescriptions    Medication Sig Dispense Auth. Provider   pyrethrins-piperonyl butoxide 0.5 % bottle Apply topically once for 1 dose. Lather body from head to soles of feet.  Leave on for 18 to 14 hours before removing.  May repeat in 14 days if symptoms persist 150 mL Rudolpho Sevin, NP     PDMP not reviewed this encounter.   Rudolpho Sevin, NP 04/07/21 2211

## 2021-04-07 NOTE — Discharge Instructions (Signed)
Use permethrin as prescribed.  Again can repeat in 14 days if symptoms persist.  You will likely need to call pest control to take care of issue.  Please return for any worsening or persistent symptoms

## 2021-04-07 NOTE — ED Triage Notes (Addendum)
Pt In with c/o insect bites and rash on her body that started last Monday. Pt states she is itching all over and she feels a burning sensation  Pt states she has taken benadryl, oatmeal baths, and topical ointment with no relief

## 2021-04-30 ENCOUNTER — Other Ambulatory Visit: Payer: Self-pay

## 2021-04-30 ENCOUNTER — Emergency Department (HOSPITAL_COMMUNITY)
Admission: EM | Admit: 2021-04-30 | Discharge: 2021-04-30 | Disposition: A | Discharge status: Home / Self Care | Payer: Self-pay | Attending: Emergency Medicine | Admitting: Emergency Medicine

## 2021-04-30 ENCOUNTER — Encounter (HOSPITAL_COMMUNITY): Payer: Self-pay | Admitting: Emergency Medicine

## 2021-04-30 DIAGNOSIS — R21 Rash and other nonspecific skin eruption: Secondary | ICD-10-CM

## 2021-04-30 DIAGNOSIS — J45909 Unspecified asthma, uncomplicated: Secondary | ICD-10-CM | POA: Insufficient documentation

## 2021-04-30 DIAGNOSIS — L539 Erythematous condition, unspecified: Secondary | ICD-10-CM | POA: Insufficient documentation

## 2021-04-30 MED ORDER — HYDROXYZINE HCL 25 MG PO TABS
25.0000 mg | ORAL_TABLET | Freq: Three times a day (TID) | ORAL | 0 refills | Status: AC | PRN
Start: 2021-04-30 — End: 2021-05-07

## 2021-04-30 NOTE — Discharge Instructions (Addendum)
You came to the [facility] today to have your rash evaluated. You were physical exam I am concerned that you may have exposure to fleas.  Given you a prescription for Atarax to help with your itching.  You may also take over-the-counter cetirizine once daily.  Please treat your pets for fleas and wash all items of linens.  Please follow-up with dermatologist if your symptoms do not improve.  [facility] Dermatologists:   [facility]  3.2 [phone])  Dermatologist  [place] [address] [place]  [phone]   Dr. Michelene Gardener, MD  2.6 [phone])  Dermatologist  [address]  [phone]  [facility]  3.5 (3)  [facility]  [place]  [phone]   [place]  4.0 (4)  Dermatologist  [place]  [phone]  Lavonna Monarch MD  3.0 (2)  Dermatologist  [place]  [phone]  Katrina Stack  2.7 (6)  Dermatologist  [place]  [phone]  [place] Amy Y MD  2.0 (1)  Dermatologist  [place]  [phone]  [place]  5.0 (3)  Doctor  [address]  [phone]     Get help right away if you: Have a fever and your symptoms suddenly get worse. Develop confusion. Have a severe headache or a stiff neck. Have severe joint pains or stiffness. Have a seizure. Develop a rash that covers all or most of your body. The rash may or may not be painful. Develop blisters that: Are on top of the rash. Grow larger or grow together. Are painful. Are inside your nose or mouth. Develop a rash that: Looks like purple pinprick-sized spots all over your body. Has a "bull's eye" or looks like a target. Is not related to sun exposure, is red and painful, and causes your skin to peel.

## 2021-04-30 NOTE — ED Triage Notes (Signed)
Patient reports itching rash to whole body x1 month. States prescribed permethrin with some relief but rash has returned.

## 2021-04-30 NOTE — ED Provider Notes (Signed)
[facility] DEPT Provider Note   CSN: [id number] Arrival date & time: 04/30/21  0737     History Chief Complaint  Patient presents with   Rash    Danna Sewell is a 50 y.o. female with a history of seasonal allergies.  Patient presents to the emergency department with a chief complaint of rash.  Patient first noticed rash 1 month prior.  Per chart review patient was seen at urgent care on 5/19 and prescribed permethrin cream.  Patient reports that her symptoms improved after using this medication.  Patient states that rash has gotten worse over the last 3 days.  Patient endorses pruritus and warmth at sites of rash.  Rashes spread across her chest, abdomen, back, buttocks and bilateral upper and lower extremities.  Patient denies any fever, neck stiffness, purulent discharge.    Patient states that her daughter is also being affected with a similar rash.  Patient has tried cleaning all items of clothing and linens in hot water.  Patient has not had any recent changes to diet, detergents, personal care products.  Patient states that she has seen small white bugs in her home.  Patient has a cat and a dog.    Rash Associated symptoms: no fever       Past Medical History:  Diagnosis Date   Allergy    seasonal   Anemia    Asthma    childhood   GERD (gastroesophageal reflux disease)     Patient Active Problem List   Diagnosis Date Noted   Leukocytosis 07/14/2017   Viral illness 07/14/2017   Dehydration 07/14/2017   SIRS (systemic inflammatory response syndrome) ([place]) 07/13/2017   Cervical cancer screening 02/21/2016   Pelvic cramping 02/21/2016   Screening for HPV (human papillomavirus) 02/21/2016    Past Surgical History:  Procedure Laterality Date   infection giving birth       OB History   No obstetric history on file.     Family History  Problem Relation Age of Onset   Hyperlipidemia Mother    Hyperlipidemia Father    Heart disease  Father     Social History   Tobacco Use   Smoking status: Never   Smokeless tobacco: Never  Vaping Use   Vaping Use: Never used  Substance Use Topics   Alcohol use: No   Drug use: No    Home Medications Prior to Admission medications   Medication Sig Start Date End Date Taking? Authorizing Provider  famotidine (PEPCID) 20 MG tablet Take 1 tablet (20 mg total) by mouth 2 (two) times daily. 01/06/21   Carlisle Cater, PA-C  oxyCODONE (OXY IR/ROXICODONE) 5 MG immediate release tablet Take 1 tablet (5 mg total) by mouth every 6 (six) hours as needed for severe pain. 01/06/21   Carlisle Cater, PA-C  pantoprazole (PROTONIX) 20 MG tablet Take 1 tablet (20 mg total) by mouth daily. 01/06/21   Carlisle Cater, PA-C  permethrin (ELIMITE) 5 % cream Apply to affected area once 04/07/21   Rudolpho Sevin, NP  sucralfate (CARAFATE) 1 GM/10ML suspension Take 10 mLs (1 g total) by mouth 4 (four) times daily -  with meals and at bedtime. 01/06/21   Carlisle Cater, PA-C    Allergies    Patient has no known allergies.  Review of Systems   Review of Systems  Constitutional:  Positive for chills. Negative for fever.  Musculoskeletal:  Negative for neck stiffness.  Skin:  Positive for rash. Negative for color change,  pallor and wound.   Physical Exam Updated Vital Signs BP 137/76 (BP Location: Right Arm)   Pulse 79   Temp 98.2 F (36.8 C) (Oral)   Resp 16   SpO2 100%   Physical Exam Vitals and nursing note reviewed.  Constitutional:      General: She is not in acute distress.    Appearance: She is not ill-appearing, toxic-appearing or diaphoretic.  HENT:     Head: Normocephalic.  Eyes:     General: No scleral icterus.       Right eye: No discharge.        Left eye: No discharge.  Cardiovascular:     Rate and Rhythm: Normal rate.  Pulmonary:     Effort: Pulmonary effort is normal.  Skin:    General: Skin is warm and dry.     Coloration: Skin is not ashen, cyanotic or pale.     Findings:  Rash present. No petechiae. Rash is not crusting, macular, nodular, purpuric, pustular, scaling, urticarial or vesicular.     Comments: small patches of erythema to bilateral upper and lower extremities, abdomen, and back.  Patches of erythema are blanchable.  Excoriation noted around areas of rash  Neurological:     General: No focal deficit present.     Mental Status: She is alert.  Psychiatric:        Behavior: Behavior is cooperative.      ED Results / Procedures / Treatments   Labs (all labs ordered are listed, but only abnormal results are displayed) Labs Reviewed - No data to display  EKG None  Radiology No results found.  Procedures Procedures   Medications Ordered in ED Medications - No data to display  ED Course  I have reviewed the triage vital signs and the nursing notes.  Pertinent labs & imaging results that were available during my care of the patient were reviewed by me and considered in my medical decision making (see chart for details).    MDM Rules/Calculators/A&P                          Alert 50 year old female no acute distress, nontoxic-appearing.  Patient presents with chief complaint of rash.  Patient was seen by urgent care on 5/19 for the same complaint.  Patient was prescribed permethrin with improvement of her symptoms.  Since then patient is symptoms have gradually worsened over the last 2 days.  Patient's daughter also has similar rash.  Patient has a dog and cat at home.  Rash as noted above.  Rash does not appear urticarial in nature, low suspicion for allergic reaction.  No purpura or petechia observed.  No rash or wounds to web spacing of patient's digits.  Based on appearance of rash suspect possible flea bites.  Patient given instructions to treat her animals as well as wash all linens and close.  Patient given prescription for Atarax due to her itching.  Patient to follow-up with her dermatologist.  Patient given strict return  precautions.  Patient expressed understanding of all instructions and is agreeable with this plan.  Final Clinical Impression(s) / ED Diagnoses Final diagnoses:  Rash    Rx / DC Orders ED Discharge Orders          Ordered    hydrOXYzine (ATARAX/VISTARIL) 25 MG tablet  Every 8 hours PRN        04/30/21 1100  Dyann Ruddle 04/30/21 1849    Daleen Bo, MD 05/01/21 1816

## 2021-08-26 ENCOUNTER — Encounter (HOSPITAL_COMMUNITY): Payer: Self-pay | Admitting: Emergency Medicine

## 2021-08-26 ENCOUNTER — Ambulatory Visit (HOSPITAL_COMMUNITY)
Admission: EM | Admit: 2021-08-26 | Discharge: 2021-08-26 | Disposition: A | Discharge status: Home / Self Care | Payer: Self-pay | Attending: Medical Oncology | Admitting: Medical Oncology

## 2021-08-26 ENCOUNTER — Other Ambulatory Visit: Payer: Self-pay

## 2021-08-26 DIAGNOSIS — U071 COVID-19: Secondary | ICD-10-CM | POA: Insufficient documentation

## 2021-08-26 DIAGNOSIS — J45909 Unspecified asthma, uncomplicated: Secondary | ICD-10-CM | POA: Insufficient documentation

## 2021-08-26 DIAGNOSIS — J069 Acute upper respiratory infection, unspecified: Secondary | ICD-10-CM

## 2021-08-26 DIAGNOSIS — G43909 Migraine, unspecified, not intractable, without status migrainosus: Secondary | ICD-10-CM | POA: Insufficient documentation

## 2021-08-26 DIAGNOSIS — J029 Acute pharyngitis, unspecified: Secondary | ICD-10-CM | POA: Insufficient documentation

## 2021-08-26 DIAGNOSIS — R059 Cough, unspecified: Secondary | ICD-10-CM | POA: Insufficient documentation

## 2021-08-26 DIAGNOSIS — Z8709 Personal history of other diseases of the respiratory system: Secondary | ICD-10-CM

## 2021-08-26 DIAGNOSIS — Z79899 Other long term (current) drug therapy: Secondary | ICD-10-CM | POA: Insufficient documentation

## 2021-08-26 HISTORY — DX: Migraine, unspecified, not intractable, without status migrainosus: G43.909

## 2021-08-26 MED ORDER — ALBUTEROL SULFATE HFA 108 (90 BASE) MCG/ACT IN AERS: 1.0000 | INHALATION_SPRAY | Freq: Four times a day (QID) | RESPIRATORY_TRACT | 0 refills | Status: DC | PRN

## 2021-08-26 MED ORDER — SUMATRIPTAN SUCCINATE 25 MG PO TABS: 25.0000 mg | ORAL_TABLET | Freq: Once | ORAL | 0 refills | Status: DC

## 2021-08-26 MED ORDER — BENZONATATE 200 MG PO CAPS: 200.0000 mg | ORAL_CAPSULE | Freq: Three times a day (TID) | ORAL | 0 refills | Status: DC | PRN

## 2021-08-26 MED ORDER — FLUTICASONE PROPIONATE 50 MCG/ACT NA SUSP
2.0000 | Freq: Every day | NASAL | 0 refills | Status: DC
Start: 2021-08-26 — End: 2022-11-23

## 2021-08-26 NOTE — ED Triage Notes (Addendum)
Patient c/o nasal congestion, nonproductive cough, and sore throat x 3 days.   Patient endorses headache.   Patient took an at home COVID test and reports a POSITIVE test result.   History of Migraines.   Patient request a prescription for migraines.   Patient request a COVID test for work.   Patient has taken Alka-seltzer, Excedrin, and Nyquil with no relief of symptoms.

## 2021-08-26 NOTE — ED Provider Notes (Signed)
[facility]    CSN: [id number] Arrival date & time: 08/26/21  1508      History   Chief Complaint Chief Complaint  Patient presents with   Nasal Congestion   Cough   Sore Throat    HPI Melanie Frazier is a 50 y.o. female.   HPI  Cold symptoms: Patient reports that she has had cold symptoms of nasal congestion, cough that is nonproductive and a sore throat for the past 3 days.  She also is having a flare of her migraines.  She reports that she took a home COVID test which was positive as of this morning.  She requests a repeat today for work.  Her goal of the appointment is to get medication for her migraines as prescription strength his current over-the-counter medications are helping but not fully resolving this.  She reports that she is also taken Alka-Seltzer, Excedrin and NyQuil without much relief.  She denies any significant chest pain, shortness of breath but does have a history of asthma which is mild intermittent in nature.  She denies any known fevers as well.  Of note she does have a history of SIRS listed in her chart but she is unsure of this.  Past Medical History:  Diagnosis Date   Allergy    seasonal   Anemia    Asthma    childhood   GERD (gastroesophageal reflux disease)    Migraines     Patient Active Problem List   Diagnosis Date Noted   Leukocytosis 07/14/2017   Viral illness 07/14/2017   Dehydration 07/14/2017   SIRS (systemic inflammatory response syndrome) ([place]) 07/13/2017   Cervical cancer screening 02/21/2016   Pelvic cramping 02/21/2016   Screening for HPV (human papillomavirus) 02/21/2016    Past Surgical History:  Procedure Laterality Date   infection giving birth      OB History   No obstetric history on file.      Home Medications    Prior to Admission medications   Medication Sig Start Date End Date Taking? Authorizing Provider  albuterol (VENTOLIN HFA) 108 (90 Base) MCG/ACT inhaler Inhale 1-2 puffs into the lungs  every 6 (six) hours as needed for wheezing or shortness of breath. 08/26/21  Yes Arlethia Basso M, PA-C  benzonatate (TESSALON) 200 MG capsule Take 1 capsule (200 mg total) by mouth 3 (three) times daily as needed for cough. 08/26/21  Yes Stephaniemarie Stoffel M, PA-C  fluticasone [facility]) 50 MCG/ACT nasal spray Place 2 sprays into both nostrils daily. 08/26/21  Yes Viha Kriegel M, PA-C  SUMAtriptan (IMITREX) 25 MG tablet Take 1 tablet (25 mg total) by mouth once for 1 dose. May repeat in 2 hours if headache persists or recurs. 08/26/21 08/26/21 Yes Vora Clover M, PA-C  famotidine (PEPCID) 20 MG tablet Take 1 tablet (20 mg total) by mouth 2 (two) times daily. 01/06/21   Carlisle Cater, PA-C  oxyCODONE (OXY IR/ROXICODONE) 5 MG immediate release tablet Take 1 tablet (5 mg total) by mouth every 6 (six) hours as needed for severe pain. 01/06/21   Carlisle Cater, PA-C  pantoprazole (PROTONIX) 20 MG tablet Take 1 tablet (20 mg total) by mouth daily. 01/06/21   Carlisle Cater, PA-C  permethrin (ELIMITE) 5 % cream Apply to affected area once 04/07/21   Rudolpho Sevin, NP  sucralfate (CARAFATE) 1 GM/10ML suspension Take 10 mLs (1 g total) by mouth 4 (four) times daily -  with meals and at bedtime. 01/06/21   Carlisle Cater, PA-C  Family History Family History  Problem Relation Age of Onset   Hyperlipidemia Mother    Hyperlipidemia Father    Heart disease Father     Social History Social History   Tobacco Use   Smoking status: Never   Smokeless tobacco: Never  Vaping Use   Vaping Use: Never used  Substance Use Topics   Alcohol use: No   Drug use: No     Allergies   Patient has no known allergies.   Review of Systems Review of Systems  As stated above in HPI Physical Exam Triage Vital Signs ED Triage Vitals  Enc Vitals Group     BP 08/26/21 1652 122/62     Pulse Rate 08/26/21 1652 91     Resp 08/26/21 1652 17     Temp 08/26/21 1652 98.8 F (37.1 C)     Temp Source 08/26/21 1652  Oral     SpO2 08/26/21 1652 97 %     Weight --      Height --      Head Circumference --      Peak Flow --      Pain Score 08/26/21 1648 10     Pain Loc --      Pain Edu? --      Excl. in [place]? --    No data found.  Updated Vital Signs BP 122/62 (BP Location: Left Arm)   Pulse 91   Temp 98.8 F (37.1 C) (Oral)   Resp 17   SpO2 97%   Physical Exam Vitals and nursing note reviewed.  Constitutional:      General: She is not in acute distress.    Appearance: She is well-developed. She is not ill-appearing, toxic-appearing or diaphoretic.  HENT:     Head: Normocephalic and atraumatic.     Right Ear: Tympanic membrane normal. No middle ear effusion. Tympanic membrane is not erythematous.     Left Ear: Tympanic membrane normal.  No middle ear effusion. Tympanic membrane is not erythematous.     Nose: Congestion and rhinorrhea (scant) present.     Mouth/Throat:     Mouth: Mucous membranes are moist. No oral lesions.     Pharynx: Oropharynx is clear. Uvula midline. No pharyngeal swelling, oropharyngeal exudate, posterior oropharyngeal erythema or uvula swelling.     Tonsils: No tonsillar exudate.  Eyes:     Conjunctiva/sclera: Conjunctivae normal.     Pupils: Pupils are equal, round, and reactive to light.  Cardiovascular:     Rate and Rhythm: Normal rate and regular rhythm.     Heart sounds: Normal heart sounds.  Pulmonary:     Effort: Pulmonary effort is normal.     Breath sounds: Normal breath sounds.  Musculoskeletal:     Cervical back: Normal range of motion and neck supple.  Lymphadenopathy:     Cervical: No cervical adenopathy.  Skin:    General: Skin is warm.  Neurological:     Mental Status: She is alert and oriented to person, place, and time.     UC Treatments / Results  Labs (all labs ordered are listed, but only abnormal results are displayed) Labs Reviewed  SARS CORONAVIRUS 2 (TAT 6-24 HRS)    EKG   Radiology No results  found.  Procedures Procedures (including critical care time)  Medications Ordered in UC Medications - No data to display  Initial Impression / Assessment and Plan / UC Course  I have reviewed the triage vital signs and the nursing notes.  Pertinent labs & imaging results that were available during my care of the patient were reviewed by me and considered in my medical decision making (see chart for details).     New.  Likely COVID-19 given her home positive test result.  We discussed antiviral medication given her history.  At this point she would like to hold off on antiviral medication.  I am going to send in medication to help with her symptoms as well as her migraines.  Rest and hydration encouraged.  We discussed wide range of red flag signs and symptoms.  O2 monitoring encouraged. Follow up PRN.  Final Clinical Impressions(s) / UC Diagnoses   Final diagnoses:  Viral URI with cough  History of asthma   Discharge Instructions   None    ED Prescriptions     Medication Sig Dispense Auth. Provider   SUMAtriptan (IMITREX) 25 MG tablet Take 1 tablet (25 mg total) by mouth once for 1 dose. May repeat in 2 hours if headache persists or recurs. 10 tablet Katherine Syme M, PA-C   fluticasone [facility]) 50 MCG/ACT nasal spray Place 2 sprays into both nostrils daily. 16 mL Prezley Qadir M, PA-C   benzonatate (TESSALON) 200 MG capsule Take 1 capsule (200 mg total) by mouth 3 (three) times daily as needed for cough. 20 capsule Yiannis Tulloch M, PA-C   albuterol (VENTOLIN HFA) 108 (90 Base) MCG/ACT inhaler Inhale 1-2 puffs into the lungs every 6 (six) hours as needed for wheezing or shortness of breath. 1 each Hughie Closs, PA-C      PDMP not reviewed this encounter.   Hughie Closs, [place] 08/26/21 1726

## 2021-08-27 LAB — SARS CORONAVIRUS 2 (TAT 6-24 HRS): SARS Coronavirus 2: POSITIVE — AB

## 2021-08-28 ENCOUNTER — Telehealth: Payer: Self-pay | Admitting: Emergency Medicine

## 2021-08-28 NOTE — Telephone Encounter (Signed)
Positive for covid; attempted call no answer LVMM and also sent my chart message

## 2022-02-21 ENCOUNTER — Emergency Department (HOSPITAL_COMMUNITY): Payer: Self-pay

## 2022-02-21 ENCOUNTER — Encounter (HOSPITAL_COMMUNITY): Payer: Self-pay | Admitting: Emergency Medicine

## 2022-02-21 ENCOUNTER — Emergency Department (HOSPITAL_COMMUNITY)
Admission: EM | Admit: 2022-02-21 | Discharge: 2022-02-21 | Disposition: A | Discharge status: Home / Self Care | Payer: Self-pay | Attending: Emergency Medicine | Admitting: Emergency Medicine

## 2022-02-21 DIAGNOSIS — R059 Cough, unspecified: Secondary | ICD-10-CM | POA: Insufficient documentation

## 2022-02-21 DIAGNOSIS — J029 Acute pharyngitis, unspecified: Secondary | ICD-10-CM | POA: Insufficient documentation

## 2022-02-21 DIAGNOSIS — R0981 Nasal congestion: Secondary | ICD-10-CM | POA: Insufficient documentation

## 2022-02-21 DIAGNOSIS — R0989 Other specified symptoms and signs involving the circulatory and respiratory systems: Secondary | ICD-10-CM | POA: Insufficient documentation

## 2022-02-21 DIAGNOSIS — Z20822 Contact with and (suspected) exposure to covid-19: Secondary | ICD-10-CM | POA: Insufficient documentation

## 2022-02-21 DIAGNOSIS — J069 Acute upper respiratory infection, unspecified: Secondary | ICD-10-CM

## 2022-02-21 LAB — RESP PANEL BY RT-PCR (FLU A&B, COVID) ARPGX2
Influenza A by PCR: NEGATIVE
Influenza B by PCR: NEGATIVE
SARS Coronavirus 2 by RT PCR: NEGATIVE

## 2022-02-21 MED ORDER — ACETAMINOPHEN 325 MG PO TABS
650.0000 mg | ORAL_TABLET | Freq: Once | ORAL | Status: AC
  Administered 2022-02-21: 650 mg via ORAL
  Filled 2022-02-21: qty 2

## 2022-02-21 NOTE — ED Triage Notes (Signed)
Per pt, states she has been having cold symptoms for  3 days-states body aches, cough, chills, congestion-no relief with OTC meds ?

## 2022-02-21 NOTE — ED Provider Notes (Signed)
?[facility] DEPT ?Provider Note ? ? ?CSN: [id number] ?Arrival date & time: 02/21/22  1014 ? ?  ? ?History ? ?Chief Complaint  ?Patient presents with  ? URI  ? ? ?Ali Mclaurin is a 51 y.o. female. ? ?51 year old female presents with URI symptoms times several days.  She has had runny nose congestion sore throat.  Mild cough as well.  No vomiting or diarrhea.  Using over-the-counter medications without relief ? ? ?  ? ?Home Medications ?Prior to Admission medications   ?Medication Sig Start Date End Date Taking? Authorizing Provider  ?albuterol (VENTOLIN HFA) 108 (90 Base) MCG/ACT inhaler Inhale 1-2 puffs into the lungs every 6 (six) hours as needed for wheezing or shortness of breath. 08/26/21   Hughie Closs, PA-C  ?benzonatate (TESSALON) 200 MG capsule Take 1 capsule (200 mg total) by mouth 3 (three) times daily as needed for cough. 08/26/21   Hughie Closs, PA-C  ?famotidine (PEPCID) 20 MG tablet Take 1 tablet (20 mg total) by mouth 2 (two) times daily. 01/06/21   Carlisle Cater, PA-C  ?fluticasone (FLONASE) 50 MCG/ACT nasal spray Place 2 sprays into both nostrils daily. 08/26/21   Hughie Closs, PA-C  ?oxyCODONE (OXY IR/ROXICODONE) 5 MG immediate release tablet Take 1 tablet (5 mg total) by mouth every 6 (six) hours as needed for severe pain. 01/06/21   Carlisle Cater, PA-C  ?pantoprazole (PROTONIX) 20 MG tablet Take 1 tablet (20 mg total) by mouth daily. 01/06/21   Carlisle Cater, PA-C  ?permethrin (ELIMITE) 5 % cream Apply to affected area once 04/07/21   Rudolpho Sevin, NP  ?sucralfate (CARAFATE) 1 GM/10ML suspension Take 10 mLs (1 g total) by mouth 4 (four) times daily -  with meals and at bedtime. 01/06/21   Carlisle Cater, PA-C  ?SUMAtriptan (IMITREX) 25 MG tablet Take 1 tablet (25 mg total) by mouth once for 1 dose. May repeat in 2 hours if headache persists or recurs. 08/26/21 08/26/21  Hughie Closs, PA-C  ?   ? ?Allergies    ?Patient has no known allergies.    ? ?Review of Systems   ?Review of Systems  ?All other systems reviewed and are negative. ? ?Physical Exam ?Updated Vital Signs ?BP (!) 141/83 (BP Location: Left Arm)   Pulse (!) 101   Temp 100.3 ?F (37.9 ?C) (Oral)   Resp 18   SpO2 95%  ?Physical Exam ?Vitals and nursing note reviewed.  ?Constitutional:   ?   General: She is not in acute distress. ?   Appearance: Normal appearance. She is well-developed. She is not toxic-appearing.  ?HENT:  ?   Head: Normocephalic and atraumatic.  ?Eyes:  ?   General: Lids are normal.  ?   Conjunctiva/sclera: Conjunctivae normal.  ?   Pupils: Pupils are equal, round, and reactive to light.  ?Neck:  ?   Thyroid: No thyroid mass.  ?   Trachea: No tracheal deviation.  ?Cardiovascular:  ?   Rate and Rhythm: Normal rate and regular rhythm.  ?   Heart sounds: Normal heart sounds. No murmur heard. ?  No gallop.  ?Pulmonary:  ?   Effort: Pulmonary effort is normal. No respiratory distress.  ?   Breath sounds: Normal breath sounds. No stridor. No decreased breath sounds, wheezing, rhonchi or rales.  ?Abdominal:  ?   General: There is no distension.  ?   Palpations: Abdomen is soft.  ?   Tenderness: There is no abdominal tenderness. There is  no rebound.  ?Musculoskeletal:     ?   General: No tenderness. Normal range of motion.  ?   Cervical back: Normal range of motion and neck supple.  ?Skin: ?   General: Skin is warm and dry.  ?   Findings: No abrasion or rash.  ?Neurological:  ?   Mental Status: She is alert and oriented to person, place, and time. Mental status is at baseline.  ?   GCS: GCS eye subscore is 4. GCS verbal subscore is 5. GCS motor subscore is 6.  ?   Cranial Nerves: No cranial nerve deficit.  ?   Sensory: No sensory deficit.  ?   Motor: Motor function is intact.  ?Psychiatric:     ?   Attention and Perception: Attention normal.     ?   Speech: Speech normal.     ?   Behavior: Behavior normal.  ? ? ?ED Results / Procedures / Treatments   ?Labs ?(all labs ordered are  listed, but only abnormal results are displayed) ?Labs Reviewed  ?RESP PANEL BY RT-PCR (FLU A&B, COVID) [id number]  ? ? ?EKG ?None ? ?Radiology ?DG Chest 2 View ? ?Result Date: 02/21/2022 ?CLINICAL DATA:  51 year old female with a history of cough EXAM: CHEST - 2 VIEW COMPARISON:  09/12/2017 FINDINGS: Cardiomediastinal silhouette unchanged in size and contour. No evidence of central vascular congestion. No interlobular septal thickening. No pneumothorax or pleural effusion. Coarsened interstitial markings, with no confluent airspace disease. No acute displaced fracture. Degenerative changes of the spine. IMPRESSION: Negative for acute cardiopulmonary disease Electronically Signed   By: Corrie Mckusick D.O.   On: 02/21/2022 10:55   ? ?Procedures ?Procedures  ? ? ?Medications Ordered in ED ?Medications - No data to display ? ?ED Course/ Medical Decision Making/ A&P ?  ?                        ?Medical Decision Making ?Amount and/or Complexity of Data Reviewed ?Radiology: ordered. ? ?Risk ?OTC drugs. ? ? ?Patient's chest x-ray per interpretation shows no acute findings.  Patient's COVID and flu test negative here.  Suspect viral infection.  Treated with Tylenol and will discharge home ? ? ? ? ? ? ? ?Final Clinical Impression(s) / ED Diagnoses ?Final diagnoses:  ?None  ? ? ?Rx / DC Orders ?ED Discharge Orders   ? ? None  ? ?  ? ? ?  ?Lacretia Leigh, MD ?02/21/22 1211 ? ?

## 2022-02-21 NOTE — ED Notes (Signed)
I provided reinforced discharge education based off of discharge instructions. Pt acknowledged and understood my education. Pt had no further questions/concerns for provider/myself.  °

## 2022-02-23 ENCOUNTER — Other Ambulatory Visit: Payer: Self-pay

## 2022-02-23 ENCOUNTER — Encounter (HOSPITAL_COMMUNITY): Payer: Self-pay

## 2022-02-23 ENCOUNTER — Emergency Department (HOSPITAL_COMMUNITY)
Admission: EM | Admit: 2022-02-23 | Discharge: 2022-02-23 | Disposition: A | Discharge status: Home / Self Care | Payer: Self-pay | Attending: Emergency Medicine | Admitting: Emergency Medicine

## 2022-02-23 DIAGNOSIS — J069 Acute upper respiratory infection, unspecified: Secondary | ICD-10-CM | POA: Insufficient documentation

## 2022-02-23 DIAGNOSIS — R519 Headache, unspecified: Secondary | ICD-10-CM | POA: Insufficient documentation

## 2022-02-23 DIAGNOSIS — R112 Nausea with vomiting, unspecified: Secondary | ICD-10-CM | POA: Insufficient documentation

## 2022-02-23 MED ORDER — BENZONATATE 100 MG PO CAPS: 100.0000 mg | ORAL_CAPSULE | Freq: Three times a day (TID) | ORAL | 0 refills | Status: DC

## 2022-02-23 MED ORDER — ONDANSETRON 4 MG PO TBDP: 4.0000 mg | ORAL_TABLET | Freq: Once | ORAL | Status: DC

## 2022-02-23 MED ORDER — DIPHENHYDRAMINE HCL 50 MG/ML IJ SOLN
25.0000 mg | Freq: Once | INTRAMUSCULAR | Status: AC
  Administered 2022-02-23: 25 mg via INTRAMUSCULAR
  Filled 2022-02-23: qty 1

## 2022-02-23 MED ORDER — BUTALBITAL-APAP-CAFFEINE 50-325-40 MG PO TABS: 1.0000 | ORAL_TABLET | Freq: Four times a day (QID) | ORAL | 0 refills | Status: AC | PRN

## 2022-02-23 MED ORDER — ONDANSETRON 4 MG PO TBDP: ORAL_TABLET | ORAL | 0 refills | Status: DC

## 2022-02-23 MED ORDER — PROCHLORPERAZINE EDISYLATE 10 MG/2ML IJ SOLN
10.0000 mg | Freq: Once | INTRAMUSCULAR | Status: AC
  Administered 2022-02-23: 10 mg via INTRAMUSCULAR
  Filled 2022-02-23: qty 2

## 2022-02-23 NOTE — Discharge Instructions (Signed)
Take tylenol 2 pills 4 times a day and motrin 4 pills 3 times a day.  Drink plenty of fluids.  Return for worsening shortness of breath, headache, confusion. Follow up with your family doctor.  ? ?If you take the medicine I prescribed you for headache you are going to have to decrease the amount of Tylenol that you take as written above. ? ? ? ?

## 2022-02-23 NOTE — ED Provider Notes (Signed)
?[facility] DEPT ?Provider Note ? ? ?CSN: [id number] ?Arrival date & time: 02/23/22  0315 ? ?  ? ?History ? ?Chief Complaint  ?Patient presents with  ? Headache  ? Cough  ? Emesis  ? Sore Throat  ? ? ?Melanie Frazier is a 51 y.o. female. ? ?51 yo F with a chief complaints of cough congestion nausea vomiting sore throat headache.  This been going on for about 4 5 days now.  She is actually seen in the Emergency Department about 48 hours ago had a chest x-ray and rapid COVID and flu testing that was negative.  She thinks she has felt worse since then.  She really wants something for her headache. ? ? ?Headache ?Associated symptoms: cough and vomiting   ?Cough ?Associated symptoms: headaches   ?Emesis ?Associated symptoms: cough and headaches   ?Sore Throat ?Associated symptoms include headaches.  ? ?  ? ?Home Medications ?Prior to Admission medications   ?Medication Sig Start Date End Date Taking? Authorizing Provider  ?benzonatate (TESSALON) 100 MG capsule Take 1 capsule (100 mg total) by mouth every 8 (eight) hours. 02/23/22  Yes Deno Etienne, DO  ?butalbital-acetaminophen-caffeine (FIORICET) 50-325-40 MG tablet Take 1-2 tablets by mouth every 6 (six) hours as needed for headache. 02/23/22 02/23/23 Yes Deno Etienne, DO  ?ondansetron (ZOFRAN-ODT) 4 MG disintegrating tablet '4mg'$  ODT q4 hours prn nausea/vomit 02/23/22  Yes Deno Etienne, DO  ?albuterol (VENTOLIN HFA) 108 (90 Base) MCG/ACT inhaler Inhale 1-2 puffs into the lungs every 6 (six) hours as needed for wheezing or shortness of breath. 08/26/21   Hughie Closs, PA-C  ?famotidine (PEPCID) 20 MG tablet Take 1 tablet (20 mg total) by mouth 2 (two) times daily. 01/06/21   Carlisle Cater, PA-C  ?fluticasone (FLONASE) 50 MCG/ACT nasal spray Place 2 sprays into both nostrils daily. 08/26/21   Hughie Closs, PA-C  ?oxyCODONE (OXY IR/ROXICODONE) 5 MG immediate release tablet Take 1 tablet (5 mg total) by mouth every 6 (six) hours as needed for severe  pain. 01/06/21   Carlisle Cater, PA-C  ?pantoprazole (PROTONIX) 20 MG tablet Take 1 tablet (20 mg total) by mouth daily. 01/06/21   Carlisle Cater, PA-C  ?permethrin (ELIMITE) 5 % cream Apply to affected area once 04/07/21   Rudolpho Sevin, NP  ?sucralfate (CARAFATE) 1 GM/10ML suspension Take 10 mLs (1 g total) by mouth 4 (four) times daily -  with meals and at bedtime. 01/06/21   Carlisle Cater, PA-C  ?SUMAtriptan (IMITREX) 25 MG tablet Take 1 tablet (25 mg total) by mouth once for 1 dose. May repeat in 2 hours if headache persists or recurs. 08/26/21 08/26/21  Hughie Closs, PA-C  ?   ? ?Allergies    ?Patient has no known allergies.   ? ?Review of Systems   ?Review of Systems  ?Respiratory:  Positive for cough.   ?Gastrointestinal:  Positive for vomiting.  ?Neurological:  Positive for headaches.  ? ?Physical Exam ?Updated Vital Signs ?BP (!) 137/95 (BP Location: Left Arm)   Pulse (!) 102   Temp 97.9 ?F (36.6 ?C) (Oral)   Resp 16   Ht '5\' 2"'$  (1.575 m)   Wt 77.1 kg   SpO2 95%   BMI 31.09 kg/m?  ?Physical Exam ?Vitals and nursing note reviewed.  ?Constitutional:   ?   General: She is not in acute distress. ?   Appearance: She is well-developed. She is not diaphoretic.  ?HENT:  ?   Head: Normocephalic and atraumatic.  ?  Eyes:  ?   Pupils: Pupils are equal, round, and reactive to light.  ?Cardiovascular:  ?   Rate and Rhythm: Normal rate and regular rhythm.  ?   Heart sounds: No murmur heard. ?  No friction rub. No gallop.  ?Pulmonary:  ?   Effort: Pulmonary effort is normal.  ?   Breath sounds: No wheezing or rales.  ?Abdominal:  ?   General: There is no distension.  ?   Palpations: Abdomen is soft.  ?   Tenderness: There is no abdominal tenderness.  ?Musculoskeletal:     ?   General: No tenderness.  ?   Cervical back: Normal range of motion and neck supple.  ?Skin: ?   General: Skin is warm and dry.  ?Neurological:  ?   Mental Status: She is alert and oriented to person, place, and time.  ?Psychiatric:     ?    Behavior: Behavior normal.  ? ? ?ED Results / Procedures / Treatments   ?Labs ?(all labs ordered are listed, but only abnormal results are displayed) ?Labs Reviewed - No data to display ? ?EKG ?None ? ?Radiology ?DG Chest 2 View ? ?Result Date: 02/21/2022 ?CLINICAL DATA:  51 year old female with a history of cough EXAM: CHEST - 2 VIEW COMPARISON:  09/12/2017 FINDINGS: Cardiomediastinal silhouette unchanged in size and contour. No evidence of central vascular congestion. No interlobular septal thickening. No pneumothorax or pleural effusion. Coarsened interstitial markings, with no confluent airspace disease. No acute displaced fracture. Degenerative changes of the spine. IMPRESSION: Negative for acute cardiopulmonary disease Electronically Signed   By: Corrie Mckusick D.O.   On: 02/21/2022 10:55   ? ?Procedures ?Procedures  ? ? ?Medications Ordered in ED ?Medications  ?prochlorperazine (COMPAZINE) injection 10 mg (10 mg Intramuscular Given 02/23/22 0737)  ?diphenhydrAMINE (BENADRYL) injection 25 mg (25 mg Intramuscular Given 02/23/22 0737)  ? ? ?ED Course/ Medical Decision Making/ A&P ?  ?                        ?Medical Decision Making ?Risk ?Prescription drug management. ? ? ?51 yo F with a chief complaints of not feeling well cough congestion fevers chills myalgias nausea vomiting headache going on for about 4 days now.  She is well-appearing and nontoxic.  Clear lung sounds.  No tachypnea.  Benign abdominal exam.  I discussed different options with the patient.  Discussed possibly obtaining blood work with her having nausea and vomiting which she is declining.  We will give a headache cocktail here.  Supportive medications for home.  PCP follow-up. ? ?I feel very unlikely to be meningitis.  No meningeal signs.  Well-appearing nontoxic. ? ?7:47 AM:  I have discussed the diagnosis/risks/treatment options with the patient.  Evaluation and diagnostic testing in the emergency department does not suggest an emergent condition  requiring admission or immediate intervention beyond what has been performed at this time.  They will follow up with  PCP. We also discussed returning to the ED immediately if new or worsening sx occur. We discussed the sx which are most concerning (e.g., sudden worsening pain, fever, inability to tolerate by mouth) that necessitate immediate return. Medications administered to the patient during their visit and any new prescriptions provided to the patient are listed below. ? ?Medications given during this visit ?Medications  ?prochlorperazine (COMPAZINE) injection 10 mg (10 mg Intramuscular Given 02/23/22 0737)  ?diphenhydrAMINE (BENADRYL) injection 25 mg (25 mg Intramuscular Given 02/23/22 0737)  ? ? ? ?  The patient appears reasonably screen and/or stabilized for discharge and I doubt any other medical condition or other [facility] requiring further screening, evaluation, or treatment in the ED at this time prior to discharge.  ? ? ? ? ? ? ? ? ?Final Clinical Impression(s) / ED Diagnoses ?Final diagnoses:  ?Viral upper respiratory tract infection  ? ? ?Rx / DC Orders ?ED Discharge Orders   ? ?      Ordered  ?  butalbital-acetaminophen-caffeine (FIORICET) 50-325-40 MG tablet  Every 6 hours PRN       ? 02/23/22 0737  ?  benzonatate (TESSALON) 100 MG capsule  Every 8 hours       ? 02/23/22 0737  ?  ondansetron (ZOFRAN-ODT) 4 MG disintegrating tablet       ? 02/23/22 0737  ? ?  ?  ? ?  ? ? ?  ?Deno Etienne, DO ?02/23/22 [id number] ? ?

## 2022-02-23 NOTE — ED Triage Notes (Signed)
Pt reports with headache, cough, vomiting, and sore throat over the past week. Pt was recently seen and states that her symptoms are worsening.   ?

## 2022-03-15 IMAGING — CT CT ABD-PELV W/ CM
2 of 5 series · 16 of 46 positions shown, 18 images · IV contrast (omnipaque)
Comparison: 10/04/2013

CLINICAL DATA: Epigastric pain, nausea, diarrhea, food poisoning

EXAM:
CT ABDOMEN AND PELVIS WITH CONTRAST
TECHNIQUE: Multidetector CT imaging of the abdomen and pelvis was performed
using the standard protocol following bolus administration of
intravenous contrast.
CONTRAST:  100mL OMNIPAQUE IOHEXOL 300 MG/ML  SOLN

[Series 3: a/p w/ 5mm · axial · 0.74mm/px · z∈[+384,+814]mm · 13 of 96 slices shown, 15 images]
[im 5/96  soft-tissue]
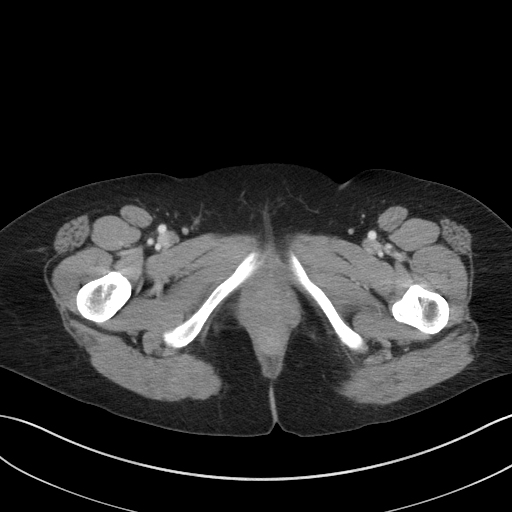
[im 5/96  bone]
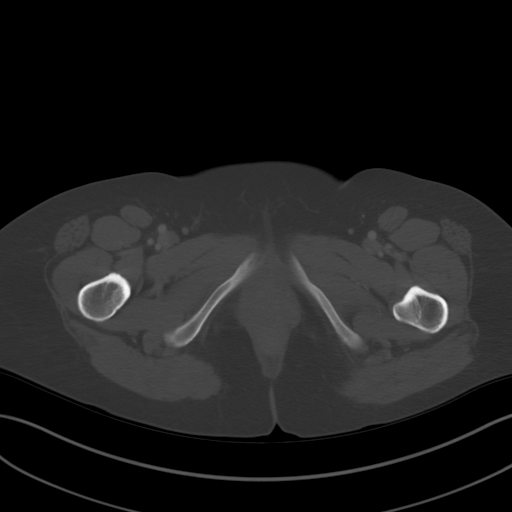
[im 15/96  soft-tissue]
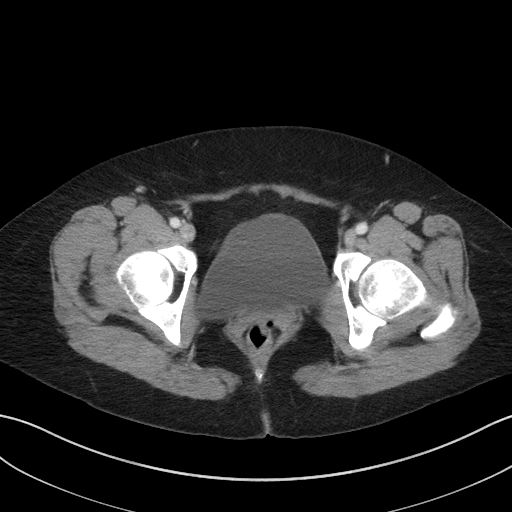
[im 20/96  soft-tissue]
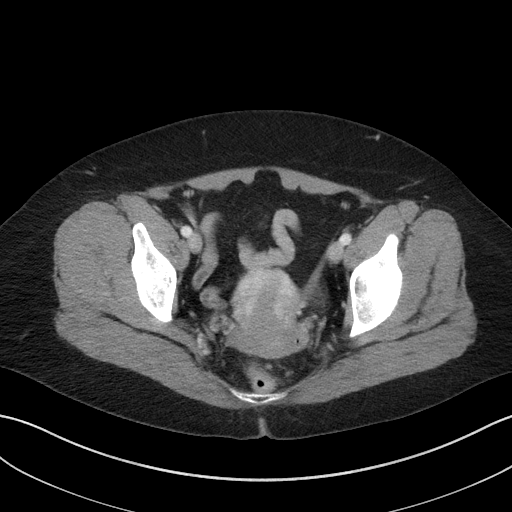
[im 29/96  soft-tissue]
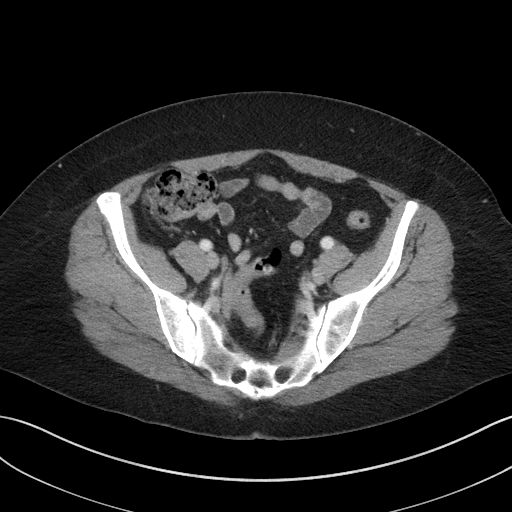
[im 34/96  soft-tissue]
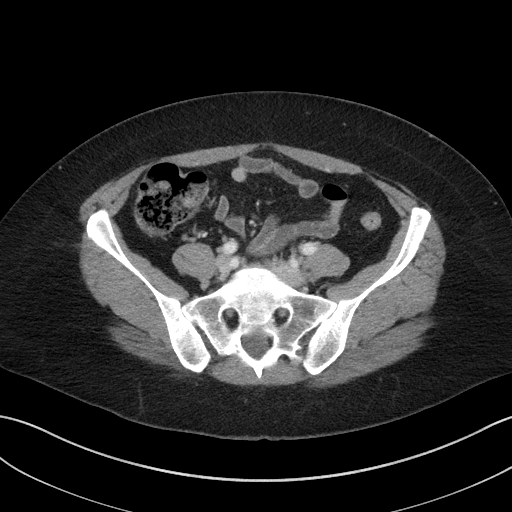
[im 43/96  soft-tissue]
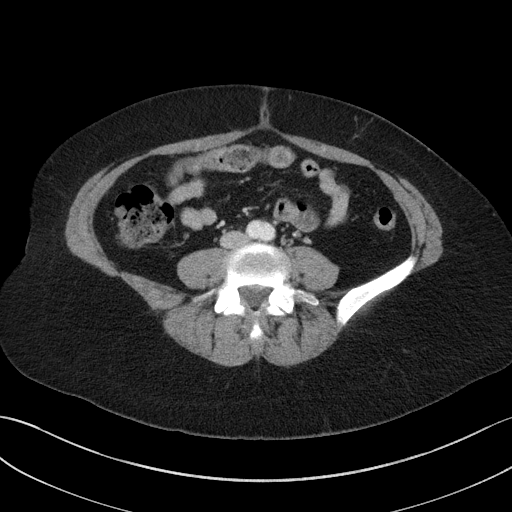
[im 48/96  soft-tissue]
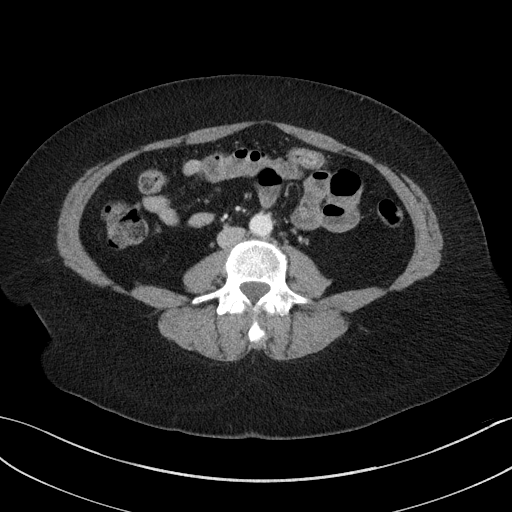
[im 53/96  soft-tissue]
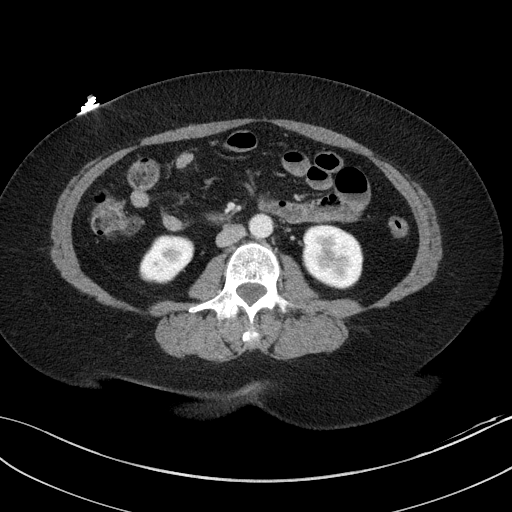
[im 62/96  soft-tissue]
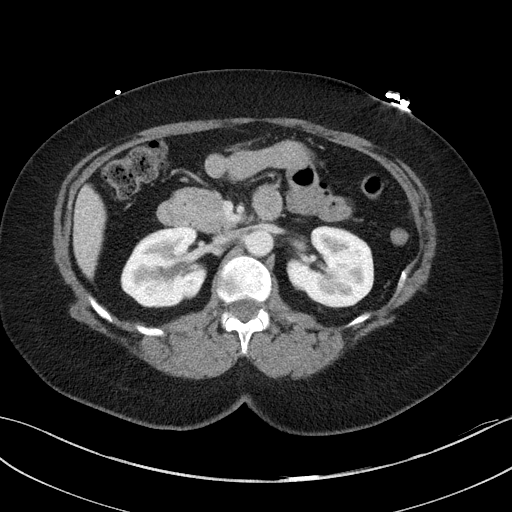
[im 62/96  bone]
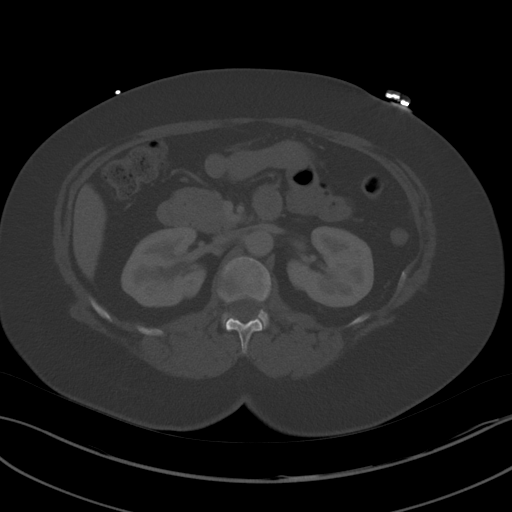
[im 67/96  soft-tissue]
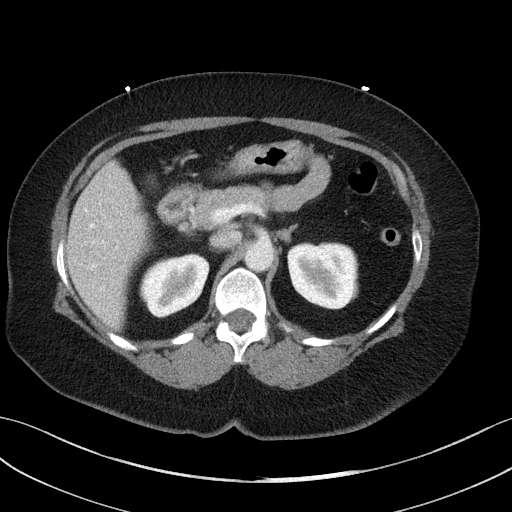
[im 77/96  soft-tissue]
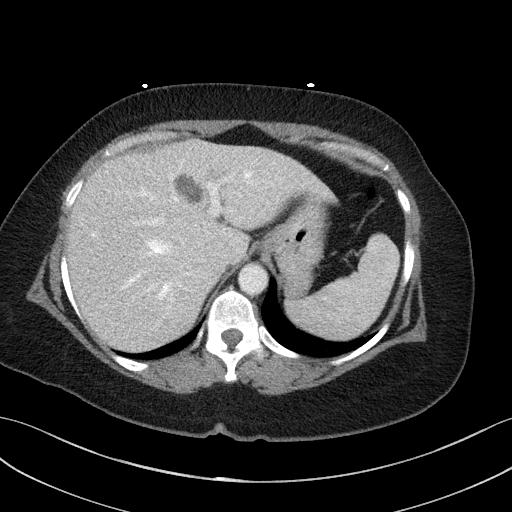
[im 81/96  soft-tissue]
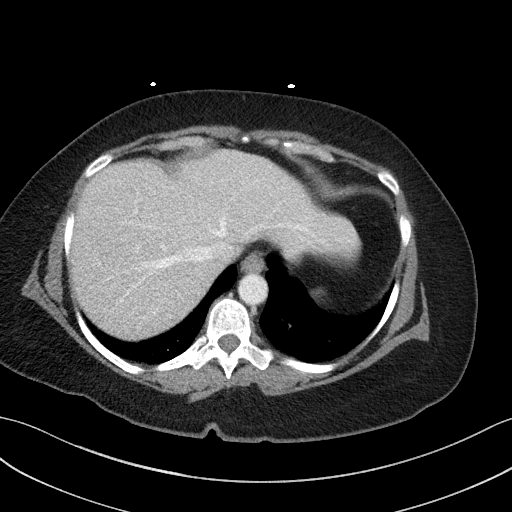
[im 91/96  soft-tissue]
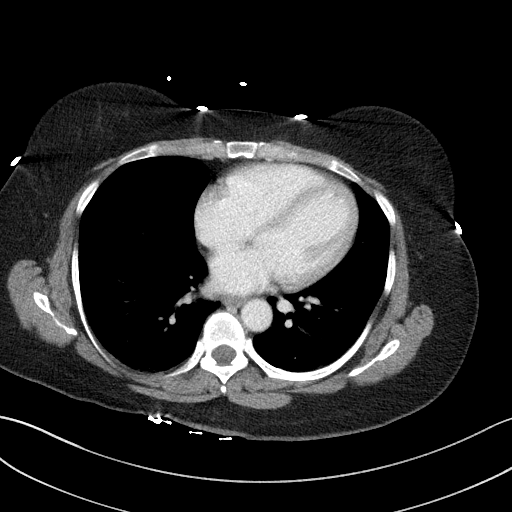

[Series 6: a/p w/ cor · coronal · 0.72mm/px · 3 of 149 slices shown]
[im 50/149  soft-tissue]
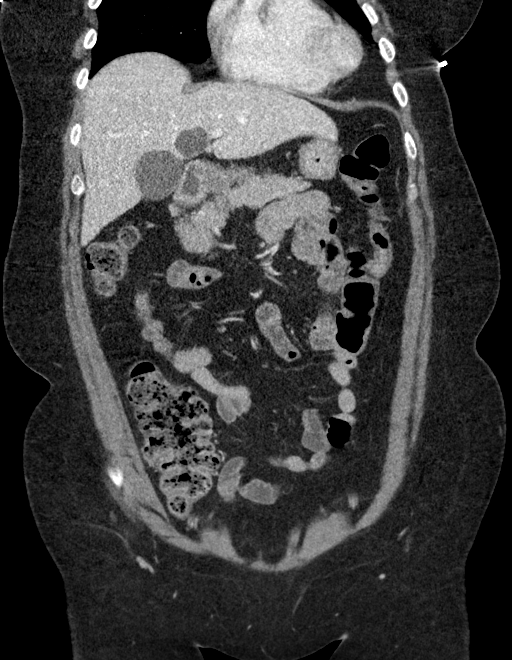
[im 66/149  soft-tissue]
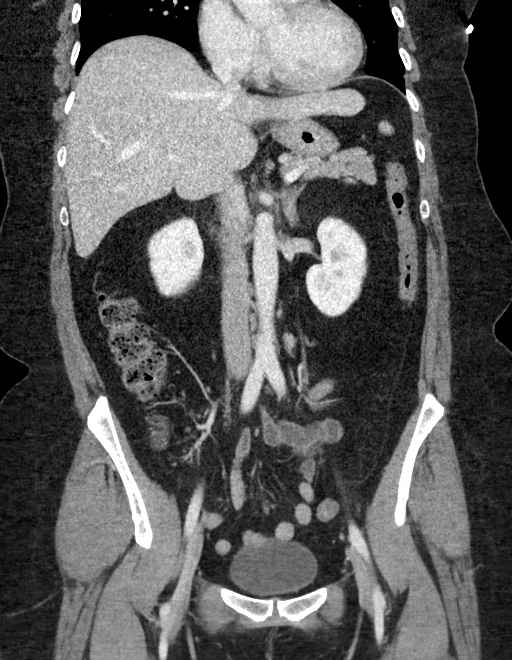
[im 83/149  soft-tissue]
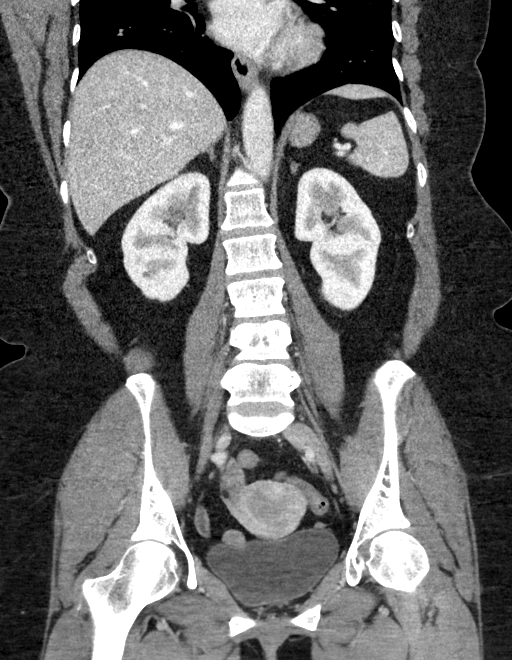

[16 of 46 positions shown; findings below may reference images not displayed]

FINDINGS: Lower chest: No acute abnormality.

Hepatobiliary: No solid liver abnormality is seen. There is a fluid
attenuation lesion of the central right lobe of the liver adjacent
to the gallbladder fossa, consistent with a cyst or hemangioma and
not significantly changed compared to prior examination no
gallstones, gallbladder wall thickening, or biliary dilatation.

Pancreas: Unremarkable. No pancreatic ductal dilatation or
surrounding inflammatory changes.

Spleen: Normal in size without significant abnormality.

Adrenals/Urinary Tract: Adrenal glands are unremarkable. Kidneys are
normal, without renal calculi, solid lesion, or hydronephrosis.
Bladder is unremarkable.

Stomach/Bowel: The gastric body and fundus are normal. There is some
wall thickening and edema of the gastric antrum, pylorus and
duodenal bulb (series 3, image 27). Appendix appears normal. No
evidence of bowel wall thickening, distention, or inflammatory
changes.

Vascular/Lymphatic: No significant vascular findings are present. No
enlarged abdominal or pelvic lymph nodes.

Reproductive: No mass or other significant abnormality. Fluid
attenuation cysts or follicles of the bilateral ovaries.

Other: No abdominal wall hernia or abnormality. No abdominopelvic
ascites.

Musculoskeletal: No acute or significant osseous findings.
IMPRESSION: There is some wall thickening and edema of the gastric antrum,
pylorus and duodenal bulb, suggestive of nonspecific infectious or
inflammatory gastritis/duodenitis, potentially including peptic
ulcer disease. No evidence of overt ulceration or perforation.

## 2022-09-20 IMAGING — US US ABDOMEN LIMITED RUQ/ASCITES
1 series · 14 of 25 positions shown · non-contrast
Comparison: [DATE] [DATE], [DATE].  [DATE] [DATE], [DATE].

CLINICAL DATA: Epigastric abdominal pain.

EXAM:
ULTRASOUND ABDOMEN LIMITED RIGHT UPPER QUADRANT

[Series 1: us abdomen limited ruq/ascites · 14 of 45 slices shown]
[im 1/45]
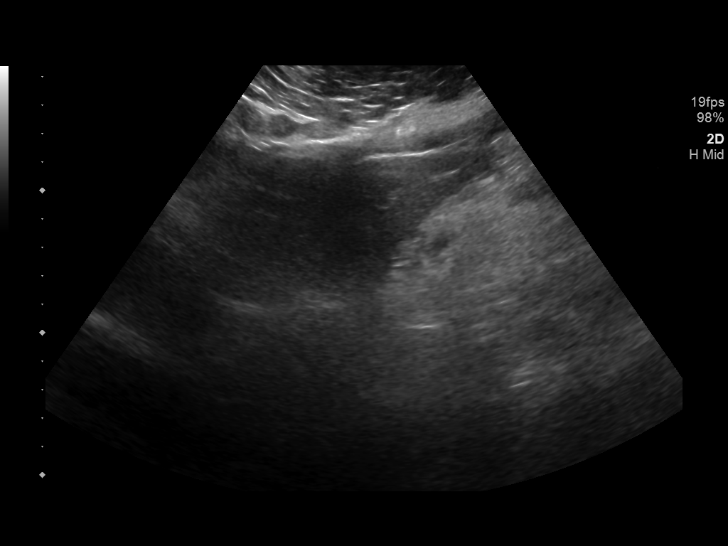
[im 4/45]
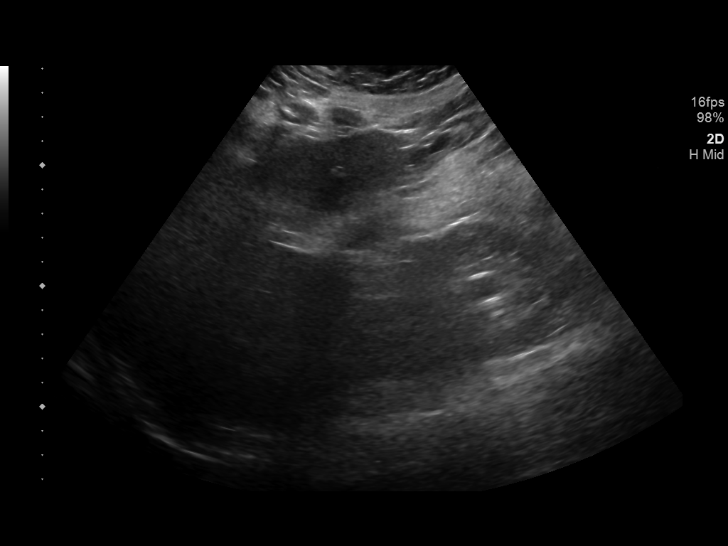
[im 8/45]
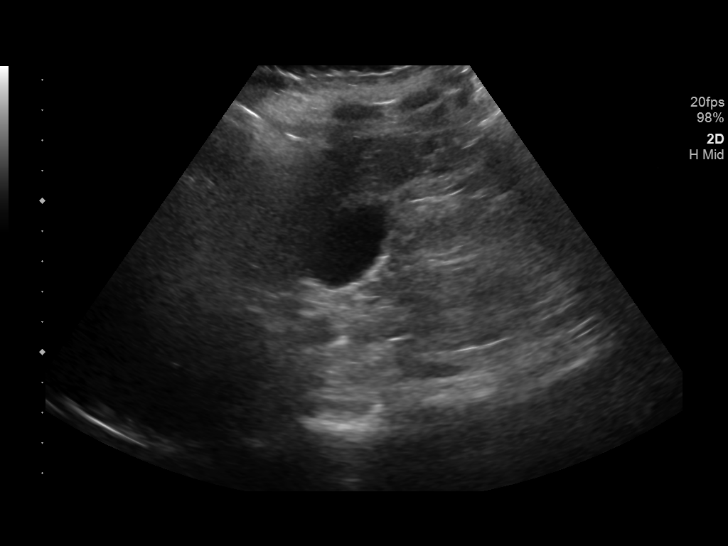
[im 12/45]
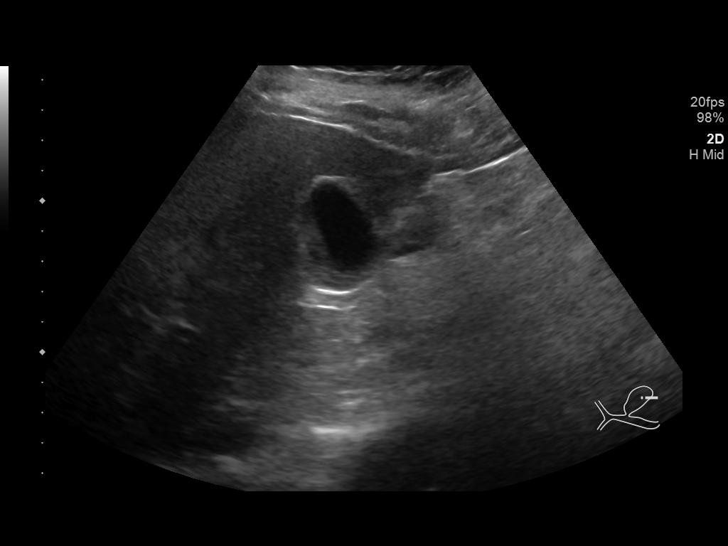
[im 15/45]
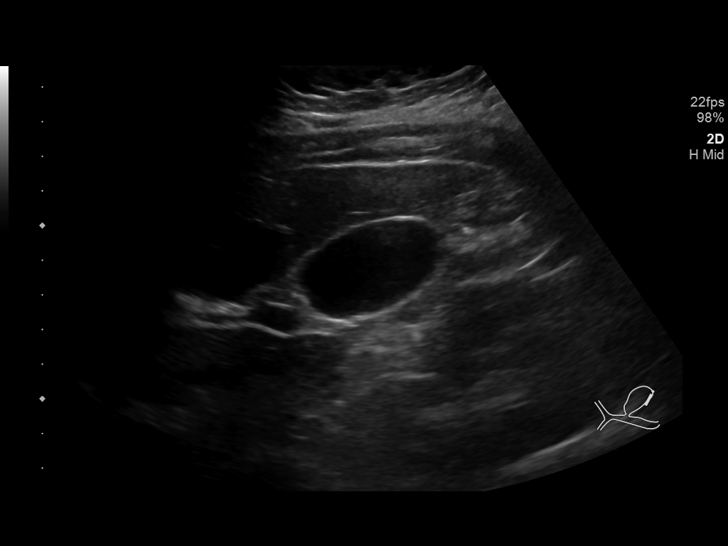
[im 17/45]
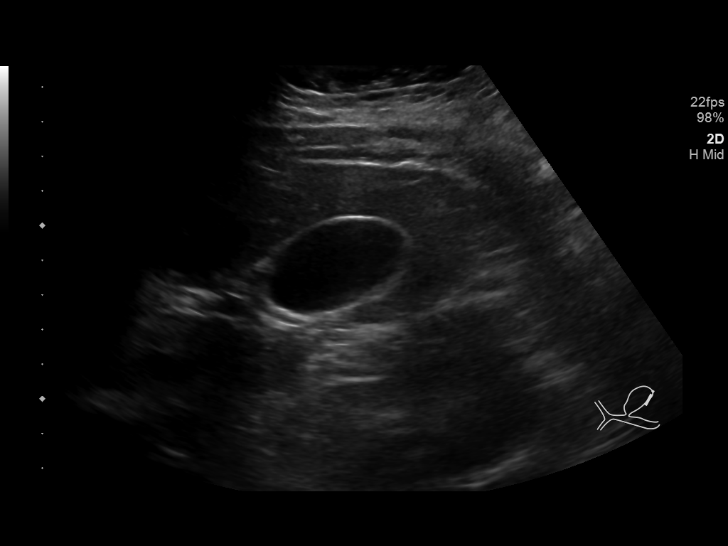
[im 21/45]
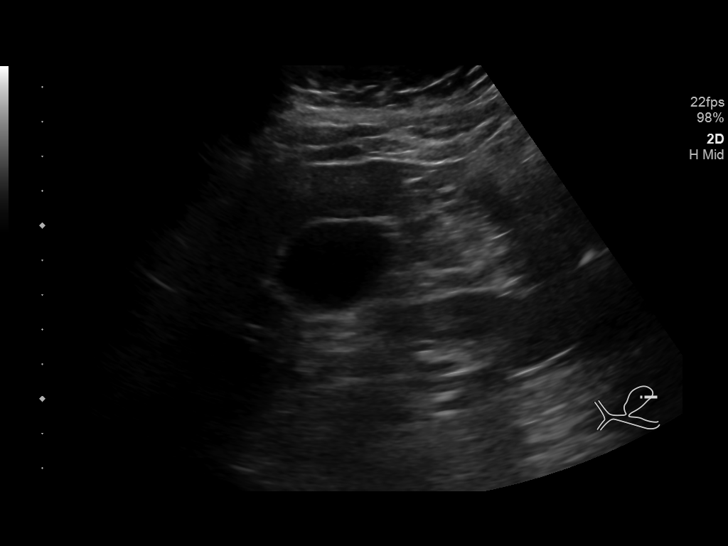
[im 24/45]
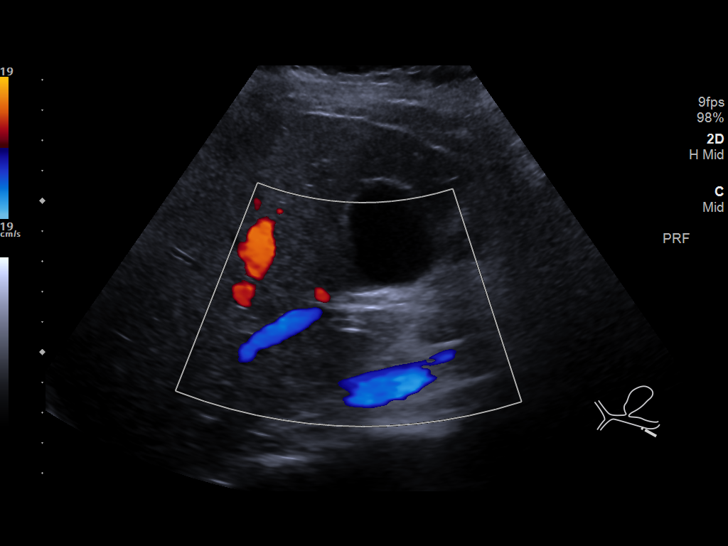
[im 28/45]
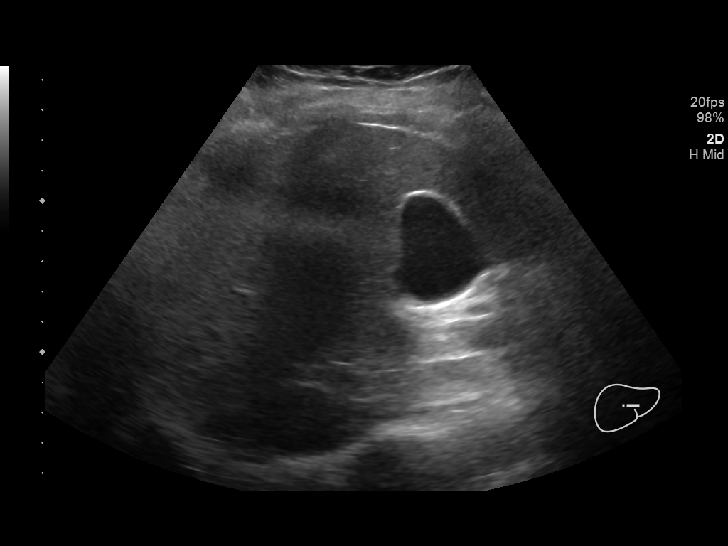
[im 30/45]
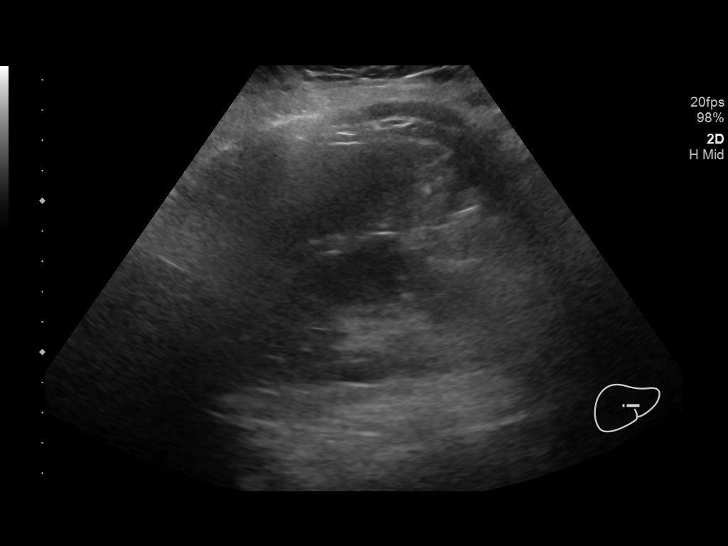
[im 34/45]
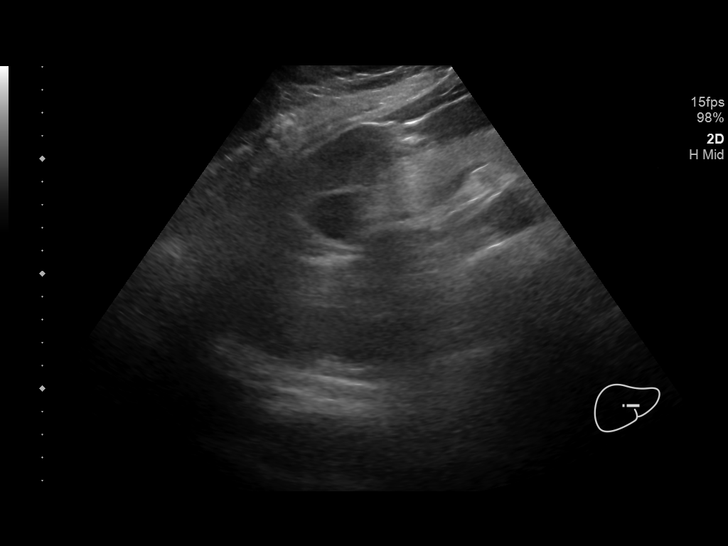
[im 37/45]
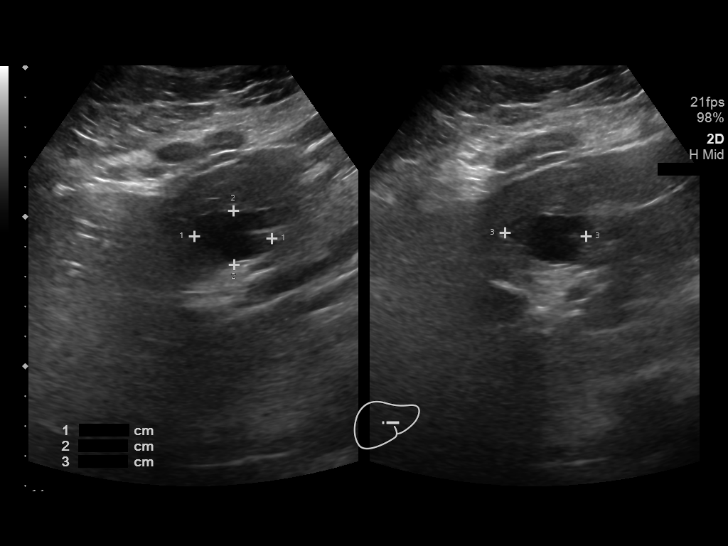
[im 41/45]
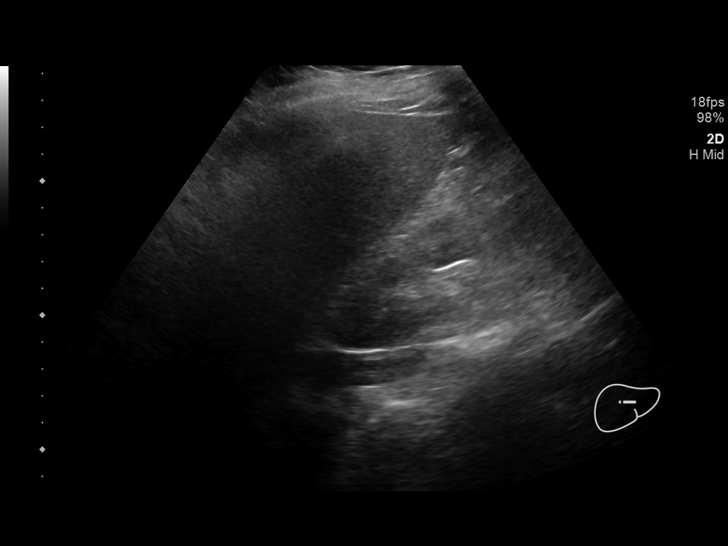
[im 45/45]
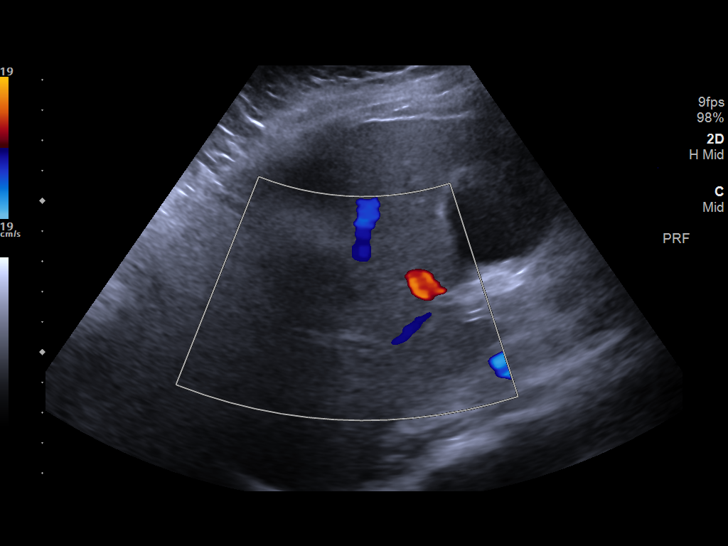

[14 of 25 positions shown; findings below may reference images not displayed]

FINDINGS: Gallbladder:

No gallstones or wall thickening visualized. No sonographic Murphy
sign noted by sonographer. Small amount of sludge is noted within
gallbladder lumen.

Common bile duct:

Diameter: 4 mm which is within normal limits.

Liver:

2.7 cm cyst is noted adjacent to gallbladder fossa in right hepatic
lobe. Within normal limits in parenchymal echogenicity. Portal vein
is patent on color Doppler imaging with normal direction of blood
flow towards the liver.

Other: None.
IMPRESSION: Right hepatic lobe cyst. Small amount of sludge seen within
gallbladder lumen. No other abnormality seen in the right upper
quadrant of the abdomen.

## 2022-11-23 ENCOUNTER — Ambulatory Visit (HOSPITAL_COMMUNITY)
Admission: EM | Admit: 2022-11-23 | Discharge: 2022-11-23 | Disposition: A | Discharge status: Home / Self Care | Payer: Self-pay | Attending: Emergency Medicine | Admitting: Emergency Medicine

## 2022-11-23 ENCOUNTER — Encounter (HOSPITAL_COMMUNITY): Payer: Self-pay | Admitting: *Deleted

## 2022-11-23 DIAGNOSIS — J020 Streptococcal pharyngitis: Secondary | ICD-10-CM

## 2022-11-23 DIAGNOSIS — J111 Influenza due to unidentified influenza virus with other respiratory manifestations: Secondary | ICD-10-CM

## 2022-11-23 LAB — POC INFLUENZA A AND B ANTIGEN (URGENT CARE ONLY)
INFLUENZA A ANTIGEN, POC: NEGATIVE
INFLUENZA B ANTIGEN, POC: NEGATIVE

## 2022-11-23 LAB — POCT RAPID STREP A, ED / UC: Streptococcus, Group A Screen (Direct): POSITIVE — AB

## 2022-11-23 MED ORDER — FLUTICASONE PROPIONATE 50 MCG/ACT NA SUSP: 2.0000 | Freq: Every day | NASAL | 0 refills | Status: DC

## 2022-11-23 MED ORDER — ACETAMINOPHEN 325 MG PO TABS
650.0000 mg | ORAL_TABLET | Freq: Once | ORAL | Status: AC
  Administered 2022-11-23: 650 mg via ORAL

## 2022-11-23 MED ORDER — ACETAMINOPHEN 325 MG PO TABS
ORAL_TABLET | ORAL | Status: AC
  Filled 2022-11-23: qty 2

## 2022-11-23 MED ORDER — AMOXICILLIN 500 MG PO CAPS: 500.0000 mg | ORAL_CAPSULE | Freq: Two times a day (BID) | ORAL | 0 refills | Status: AC

## 2022-11-23 MED ORDER — CETIRIZINE HCL 10 MG PO TABS: 10.0000 mg | ORAL_TABLET | Freq: Every day | ORAL | 2 refills | Status: DC

## 2022-11-23 NOTE — ED Provider Notes (Signed)
[facility]    CSN: [id number] Arrival date & time: 11/23/22  0805     History   Chief Complaint Chief Complaint  Patient presents with   Fever   Sore Throat   Headache   Nasal Congestion   Generalized Body Aches   Otalgia    HPI Melanie Frazier is a 52 y.o. female.  Presents with multiple symptoms that began yesterday No temp taken but sweating and feeling hot Body aches, sore throat, congestion, headache No cough No vomiting or diarrhea.  Decreased appetite.  Tolerating fluids by mouth  Has tried effervescent cold that did not provide relief  No known sick contacts  Past Medical History:  Diagnosis Date   Allergy    seasonal   Anemia    Asthma    childhood   GERD (gastroesophageal reflux disease)    Migraines     Patient Active Problem List   Diagnosis Date Noted   Leukocytosis 07/14/2017   Viral illness 07/14/2017   Dehydration 07/14/2017   SIRS (systemic inflammatory response syndrome) ([facility]) 07/13/2017   Cervical cancer screening 02/21/2016   Pelvic cramping 02/21/2016   Screening for HPV (human papillomavirus) 02/21/2016    Past Surgical History:  Procedure Laterality Date   infection giving birth      OB History   No obstetric history on file.      Home Medications    Prior to Admission medications   Medication Sig Start Date End Date Taking? Authorizing Provider  amoxicillin (AMOXIL) 500 MG capsule Take 1 capsule (500 mg total) by mouth 2 (two) times daily for 10 days. 11/23/22 12/03/22 Yes Faryn Sieg, Wells Guiles, PA-C  cetirizine (ZYRTEC ALLERGY) 10 MG tablet Take 1 tablet (10 mg total) by mouth daily. 11/23/22  Yes Monigue Spraggins, Wells Guiles, PA-C  butalbital-acetaminophen-caffeine (FIORICET) 50-325-40 MG tablet Take 1-2 tablets by mouth every 6 (six) hours as needed for headache. 02/23/22 02/23/23  Deno Etienne, DO  fluticasone (FLONASE) 50 MCG/ACT nasal spray Place 2 sprays into both nostrils daily. 11/23/22   Hannibal Skalla, Wells Guiles, PA-C    Family  History Family History  Problem Relation Age of Onset   Hyperlipidemia Mother    Hyperlipidemia Father    Heart disease Father     Social History Social History   Tobacco Use   Smoking status: Never   Smokeless tobacco: Never  Vaping Use   Vaping Use: Never used  Substance Use Topics   Alcohol use: No   Drug use: No     Allergies   Patient has no known allergies.   Review of Systems Review of Systems As per HPI  Physical Exam Triage Vital Signs ED Triage Vitals  Enc Vitals Group     BP 11/23/22 0839 118/74     Pulse Rate 11/23/22 0839 (!) 103     Resp 11/23/22 0839 20     Temp 11/23/22 0839 (!) 100.7 F (38.2 C)     Temp Source 11/23/22 0839 Oral     SpO2 11/23/22 0839 94 %     Weight --      Height --      Head Circumference --      Peak Flow --      Pain Score 11/23/22 0838 10     Pain Loc --      Pain Edu? --      Excl. in [place]? --    No data found.  Updated Vital Signs BP 118/74 (BP Location: Left Arm)   Pulse 99  Temp (!) 101.5 F (38.6 C) (Oral)   Resp 20   SpO2 94%    Physical Exam Vitals and nursing note reviewed.  Constitutional:      General: She is not in acute distress.    Appearance: She is not ill-appearing.  HENT:     Right Ear: Tympanic membrane and ear canal normal.     Left Ear: Tympanic membrane and ear canal normal.     Nose: Congestion present. No rhinorrhea.     Mouth/Throat:     Mouth: Mucous membranes are moist.     Pharynx: Oropharynx is clear. Posterior oropharyngeal erythema present.     Tonsils: No tonsillar exudate or tonsillar abscesses. 3+ on the right. 3+ on the left.  Eyes:     Conjunctiva/sclera: Conjunctivae normal.  Cardiovascular:     Rate and Rhythm: Normal rate and regular rhythm.     Pulses: Normal pulses.     Heart sounds: Normal heart sounds.  Pulmonary:     Effort: Pulmonary effort is normal.     Breath sounds: Normal breath sounds.  Lymphadenopathy:     Cervical: No cervical adenopathy.   Skin:    General: Skin is warm and dry.     Findings: No rash.  Neurological:     Mental Status: She is alert and oriented to person, place, and time.     UC Treatments / Results  Labs (all labs ordered are listed, but only abnormal results are displayed) Labs Reviewed  POCT RAPID STREP A, ED / UC - Abnormal; Notable for the following components:      Result Value   Streptococcus, Group A Screen (Direct) POSITIVE (*)    All other components within normal limits  POC INFLUENZA A AND B ANTIGEN (URGENT CARE ONLY)    EKG  Radiology No results found.  Procedures Procedures   Medications Ordered in UC Medications  acetaminophen (TYLENOL) tablet 650 mg (650 mg Oral Given 11/23/22 0844)    Initial Impression / Assessment and Plan / UC Course  I have reviewed the triage vital signs and the nursing notes.  Pertinent labs & imaging results that were available during my care of the patient were reviewed by me and considered in my medical decision making (see chart for details).  Temp 100.7 on arrival, tylenol dose given Temp went up to 101 but patient would like to go home Recommend taking ibuprofen when she gets home, monitoring for fever that does not respond to medication.  Rapid flu negative.  Strep test positive. Amoxicillin twice daily x 10 days. Discussed importance of finishing full course. Discussed symptomatic care, alternating tylenol/ibuprofen every 4-6 hours for fever and pain. Recommend nasal spray and allergy med for runny nose/ear pressure. Return precautions discussed. Patient agrees to plan  Final Clinical Impressions(s) / UC Diagnoses   Final diagnoses:  Strep pharyngitis  Influenza-like illness     Discharge Instructions      Flu test negative. Your strep test was positive. Please take the medication for the full 10 days. It's important to finish all 10 days even when you are feeling better. Otherwise the infection can come back worse. Make sure to  change your toothbrush! I recommend changing day 5 and day 10.  I recommend to alternate tylenol and ibuprofen every 4-6 hours for the next several days, until the antibiotic kicks in.  You can also try daily nasal spray with once daily zyrtec. This may help your runny nose and ear pressure.  Make  sure you drink lots of fluids!     ED Prescriptions     Medication Sig Dispense Auth. Provider   fluticasone (FLONASE) 50 MCG/ACT nasal spray Place 2 sprays into both nostrils daily. 11.1 mL Salvador Bigbee, PA-C   cetirizine (ZYRTEC ALLERGY) 10 MG tablet Take 1 tablet (10 mg total) by mouth daily. 30 tablet Amori Colomb, PA-C   amoxicillin (AMOXIL) 500 MG capsule Take 1 capsule (500 mg total) by mouth 2 (two) times daily for 10 days. 20 capsule Lani Mendiola, Wells Guiles, PA-C      PDMP not reviewed this encounter.   Raiquan Chandler, Wells Guiles, [place] 11/23/22 1037

## 2022-11-23 NOTE — ED Triage Notes (Signed)
Pt states she started with fever, body aches, sore throat, congestion, headache yesterday. She has taken effervescent cold relief without relief.

## 2022-11-23 NOTE — Discharge Instructions (Addendum)
Flu test negative. Your strep test was positive. Please take the medication for the full 10 days. It's important to finish all 10 days even when you are feeling better. Otherwise the infection can come back worse. Make sure to change your toothbrush! I recommend changing day 5 and day 10.  I recommend to alternate tylenol and ibuprofen every 4-6 hours for the next several days, until the antibiotic kicks in.  You can also try daily nasal spray with once daily zyrtec. This may help your runny nose and ear pressure.  Make sure you drink lots of fluids!

## 2023-03-19 ENCOUNTER — Ambulatory Visit (HOSPITAL_COMMUNITY)
Admission: EM | Admit: 2023-03-19 | Discharge: 2023-03-19 | Disposition: A | Discharge status: Home / Self Care | Payer: Self-pay | Attending: Physician Assistant | Admitting: Physician Assistant

## 2023-03-19 ENCOUNTER — Encounter (HOSPITAL_COMMUNITY): Payer: Self-pay | Admitting: *Deleted

## 2023-03-19 ENCOUNTER — Other Ambulatory Visit: Payer: Self-pay

## 2023-03-19 DIAGNOSIS — J069 Acute upper respiratory infection, unspecified: Secondary | ICD-10-CM | POA: Insufficient documentation

## 2023-03-19 DIAGNOSIS — U071 COVID-19: Secondary | ICD-10-CM | POA: Insufficient documentation

## 2023-03-19 MED ORDER — FLUTICASONE PROPIONATE 50 MCG/ACT NA SUSP: 1.0000 | Freq: Every day | NASAL | 0 refills | Status: AC

## 2023-03-19 MED ORDER — CETIRIZINE HCL 10 MG PO TABS: 10.0000 mg | ORAL_TABLET | Freq: Every day | ORAL | 1 refills | Status: AC

## 2023-03-19 MED ORDER — PROMETHAZINE-DM 6.25-15 MG/5ML PO SYRP: 5.0000 mL | ORAL_SOLUTION | Freq: Two times a day (BID) | ORAL | 0 refills | Status: AC | PRN

## 2023-03-19 NOTE — Discharge Instructions (Signed)
Monitor your MyChart for your results.  We will contact you if you are positive for COVID.  Take cetirizine and Flonase to help with your congestion.  Use Promethazine DM for cough.  This make you sleepy so do not drive or drink alcohol while taking it.  Make sure that you rest and drink plenty of fluid.  If your symptoms or not improving within a week return for reevaluation.  If you have any worsening symptoms including chest pain, shortness of breath, high fever, nausea/vomiting interfering with oral intake you need to be seen immediately.

## 2023-03-19 NOTE — ED Triage Notes (Signed)
Pt reports being with the same Grandmother and yesterday developed a Migraine,sore throat ,chills and sore throat.

## 2023-03-19 NOTE — ED Provider Notes (Signed)
[facility]    CSN: [id number] Arrival date & time: 03/19/23  1018      History   Chief Complaint Chief Complaint  Patient presents with   Migraine   Sore Throat   Generalized Body Aches    HPI Melanie Frazier is a 52 y.o. female.   Patient presents today with a 24-hour history of URI symptoms.  Reports headache, sinus pressure, rhinorrhea, chills, body aches, sore throat.  Denies any cough, chest pain, shortness of breath, nausea, vomiting, diarrhea.  Has been exposed to COVID-19.  Has had COVID in the past with last episode approximately 2 years ago.  She has had COVID-19 vaccinations but has not had most recent booster.  She does have a history of allergies but has not been taking medication for this on a regular basis.  She denies formal diagnosis of asthma but does have this listed in her chart as a childhood diagnosis.  She has not been taking any over-the-counter medication for symptom management.  She denies any recent antibiotics or steroids.    Past Medical History:  Diagnosis Date   Allergy    seasonal   Anemia    Asthma    childhood   GERD (gastroesophageal reflux disease)    Migraines     Patient Active Problem List   Diagnosis Date Noted   Leukocytosis 07/14/2017   Viral illness 07/14/2017   Dehydration 07/14/2017   SIRS (systemic inflammatory response syndrome) ([facility]) 07/13/2017   Cervical cancer screening 02/21/2016   Pelvic cramping 02/21/2016   Screening for HPV (human papillomavirus) 02/21/2016    Past Surgical History:  Procedure Laterality Date   infection giving birth      OB History   No obstetric history on file.      Home Medications    Prior to Admission medications   Medication Sig Start Date End Date Taking? Authorizing Provider  promethazine-dextromethorphan (PROMETHAZINE-DM) 6.25-15 MG/5ML syrup Take 5 mLs by mouth 2 (two) times daily as needed for cough. 03/19/23  Yes Kuulei Kleier, Noberto Retort, PA-C  cetirizine (ZYRTEC ALLERGY)  10 MG tablet Take 1 tablet (10 mg total) by mouth at bedtime. 03/19/23   Anothony Bursch K, PA-C  fluticasone (FLONASE) 50 MCG/ACT nasal spray Place 1 spray into both nostrils daily. 03/19/23   Goddess Gebbia, Noberto Retort, PA-C    Family History Family History  Problem Relation Age of Onset   Hyperlipidemia Mother    Hyperlipidemia Father    Heart disease Father     Social History Social History   Tobacco Use   Smoking status: Never   Smokeless tobacco: Never  Vaping Use   Vaping Use: Never used  Substance Use Topics   Alcohol use: No   Drug use: No     Allergies   Patient has no known allergies.   Review of Systems Review of Systems  Constitutional:  Positive for activity change. Negative for appetite change, fatigue and fever.  HENT:  Positive for congestion, rhinorrhea, sinus pressure and sore throat. Negative for sneezing.   Respiratory:  Negative for cough and shortness of breath.   Cardiovascular:  Negative for chest pain.  Gastrointestinal:  Negative for abdominal pain, diarrhea, nausea and vomiting.  Musculoskeletal:  Positive for arthralgias and myalgias.  Neurological:  Positive for headaches. Negative for dizziness and light-headedness.     Physical Exam Triage Vital Signs ED Triage Vitals  Enc Vitals Group     BP 03/19/23 1253 127/78     Pulse Rate 03/19/23  1253 81     Resp 03/19/23 1253 18     Temp 03/19/23 1253 99.7 F (37.6 C)     Temp src --      SpO2 03/19/23 1253 98 %     Weight --      Height --      Head Circumference --      Peak Flow --      Pain Score 03/19/23 1250 7     Pain Loc --      Pain Edu? --      Excl. in [place]? --    No data found.  Updated Vital Signs BP 127/78   Pulse 81   Temp 99.7 F (37.6 C)   Resp 18   SpO2 98%   Visual Acuity Right Eye Distance:   Left Eye Distance:   Bilateral Distance:    Right Eye Near:   Left Eye Near:    Bilateral Near:     Physical Exam Vitals reviewed.  Constitutional:      General: She is  awake. She is not in acute distress.    Appearance: Normal appearance. She is well-developed. She is not ill-appearing.     Comments: Very pleasant female appears stated age in no acute distress sitting comfortably in exam room  HENT:     Head: Normocephalic and atraumatic.     Right Ear: Tympanic membrane, ear canal and external ear normal. Tympanic membrane is not erythematous or bulging.     Left Ear: Tympanic membrane, ear canal and external ear normal. Tympanic membrane is not erythematous or bulging.     Mouth/Throat:     Pharynx: Uvula midline. Posterior oropharyngeal erythema present. No oropharyngeal exudate.     Comments: Erythema and drainage in posterior oropharynx Cardiovascular:     Rate and Rhythm: Normal rate and regular rhythm.     Heart sounds: Normal heart sounds, S1 normal and S2 normal. No murmur heard. Pulmonary:     Effort: Pulmonary effort is normal.     Breath sounds: Normal breath sounds. No wheezing, rhonchi or rales.     Comments: Clear to auscultation bilaterally Psychiatric:        Behavior: Behavior is cooperative.      UC Treatments / Results  Labs (all labs ordered are listed, but only abnormal results are displayed) Labs Reviewed  SARS CORONAVIRUS 2 (TAT 6-24 HRS)    EKG   Radiology No results found.  Procedures Procedures (including critical care time)  Medications Ordered in UC Medications - No data to display  Initial Impression / Assessment and Plan / UC Course  I have reviewed the triage vital signs and the nursing notes.  Pertinent labs & imaging results that were available during my care of the patient were reviewed by me and considered in my medical decision making (see chart for details).     Patient is well-appearing, afebrile, nontoxic, nontachycardic.  No evidence of acute infection on physical exam that warrant initiation of antibiotics.  Will test for COVID given she has a known exposure and viral clinical presentation.   She technically qualifies for antiviral therapy given her history of childhood asthma, however, she is otherwise healthy and has been vaccinated though not with most recent booster so less likely to have significant benefit from Paxlovid.  We do not have a recent metabolic panel so if she is positive and interested in antivirals would recommend molnupiravir.  She is to use over-the-counter medication including Mucinex, Tylenol, Flonase.  She was prescribed Promethazine DM for cough.  Discussed that this can be sedating and she is not to drive or drink alcohol with taking it.  She is to rest and drink plenty of fluid.  If her symptoms or not improving within a week she is to return for reevaluation.  If she has any worsening symptoms including high fever, chest pain, shortness of breath, nausea/vomiting interfering with oral intake, weakness she needs to be seen immediately.  Strict return precautions given.  Work excuse note provided.  Final Clinical Impressions(s) / UC Diagnoses   Final diagnoses:  Viral URI     Discharge Instructions      Monitor your MyChart for your results.  We will contact you if you are positive for COVID.  Take cetirizine and Flonase to help with your congestion.  Use Promethazine DM for cough.  This make you sleepy so do not drive or drink alcohol while taking it.  Make sure that you rest and drink plenty of fluid.  If your symptoms or not improving within a week return for reevaluation.  If you have any worsening symptoms including chest pain, shortness of breath, high fever, nausea/vomiting interfering with oral intake you need to be seen immediately.     ED Prescriptions     Medication Sig Dispense Auth. Provider   cetirizine (ZYRTEC ALLERGY) 10 MG tablet Take 1 tablet (10 mg total) by mouth at bedtime. 30 tablet Ronit Marczak K, PA-C   fluticasone (FLONASE) 50 MCG/ACT nasal spray Place 1 spray into both nostrils daily. 16 g Lauri Till K, PA-C    promethazine-dextromethorphan (PROMETHAZINE-DM) 6.25-15 MG/5ML syrup Take 5 mLs by mouth 2 (two) times daily as needed for cough. 118 mL Lakrisha Iseman K, PA-C      PDMP not reviewed this encounter.   Jeani Hawking, PA-C 03/19/23 1318

## 2023-03-20 LAB — SARS CORONAVIRUS 2 (TAT 6-24 HRS): SARS Coronavirus 2: POSITIVE — AB

## 2023-11-05 IMAGING — CR DG CHEST 2V
2 series · 2 of 2 positions shown · non-contrast
Comparison: 09/12/2017

CLINICAL DATA: 51-year-old female with a history of cough

EXAM:
CHEST - 2 VIEW

[w chest pa]
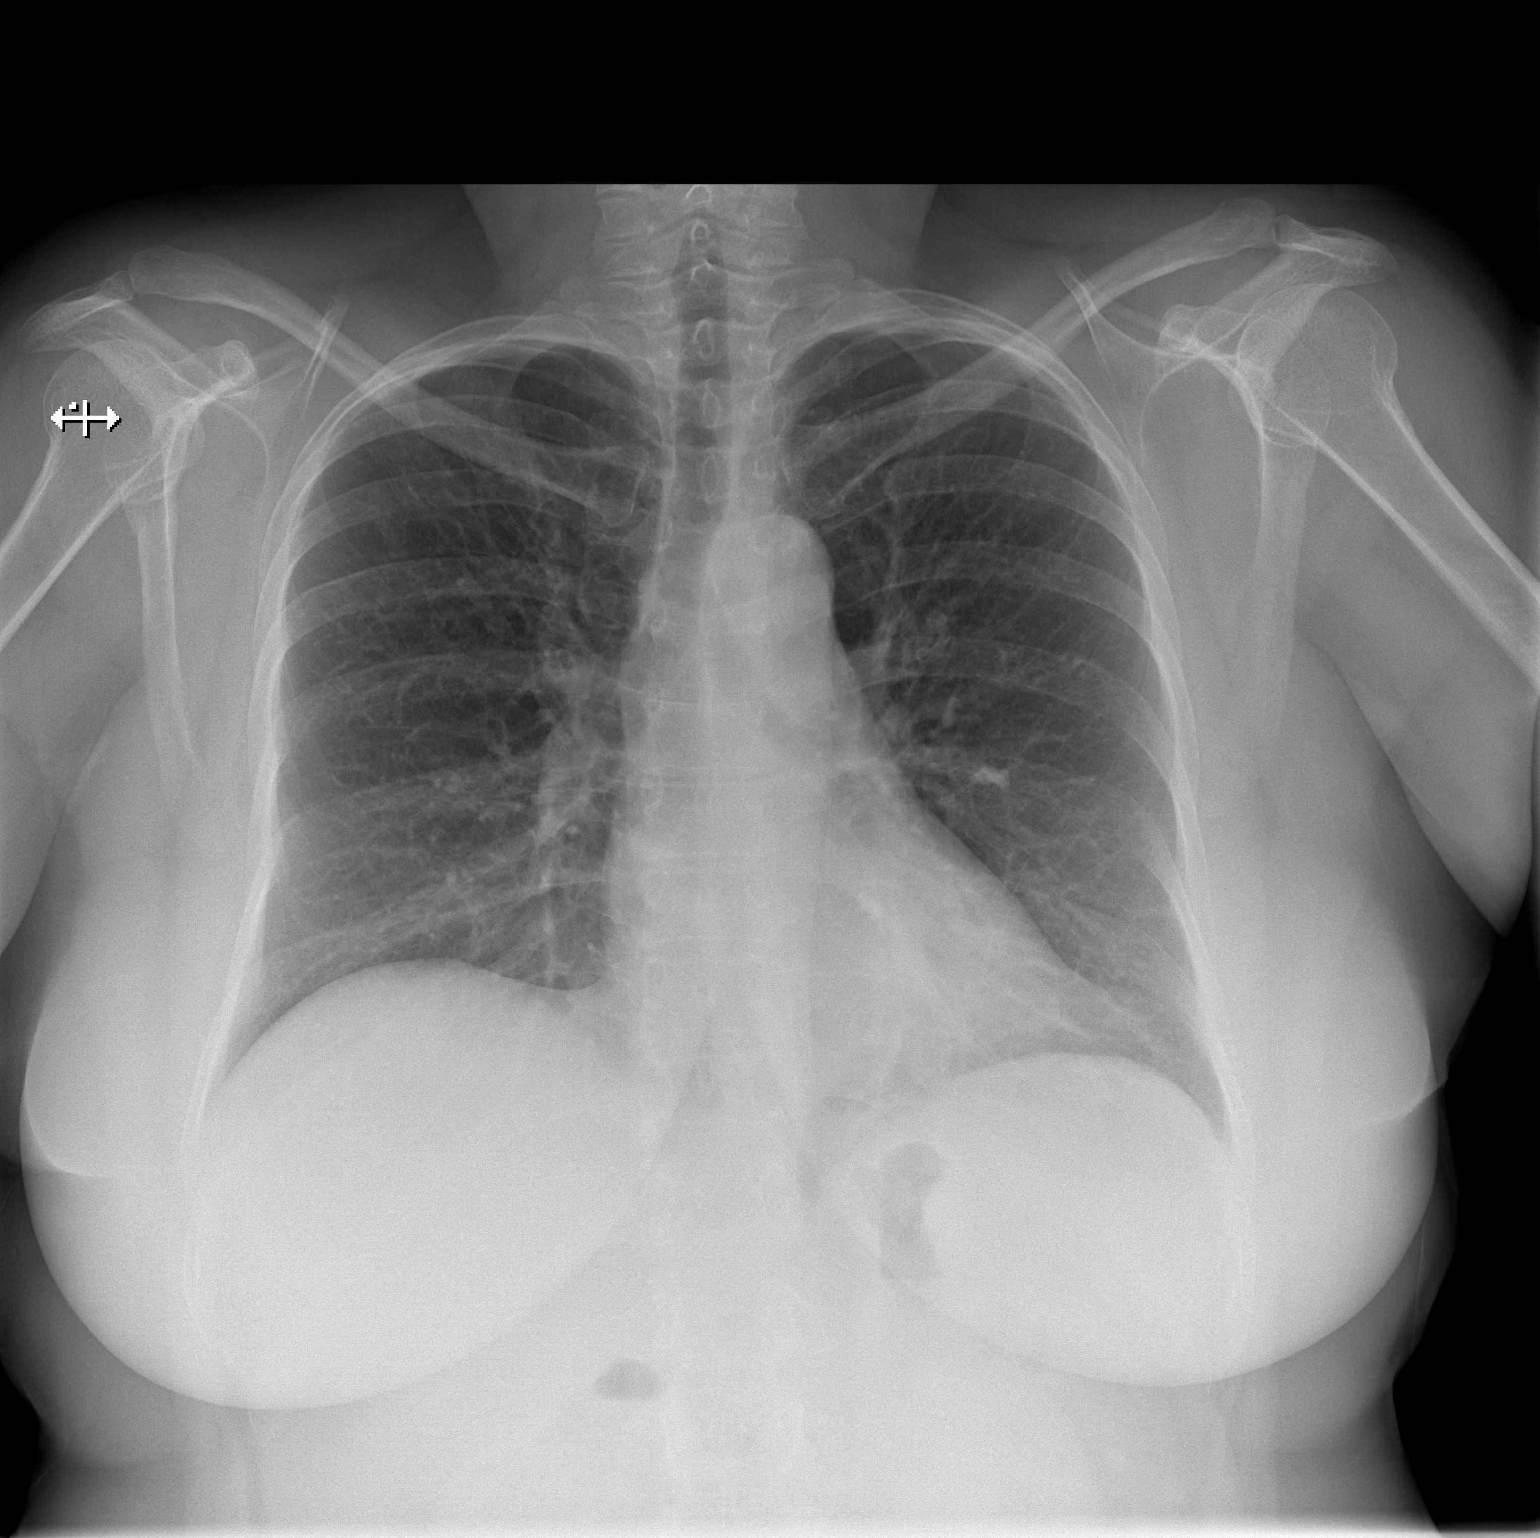

[w chest lat]
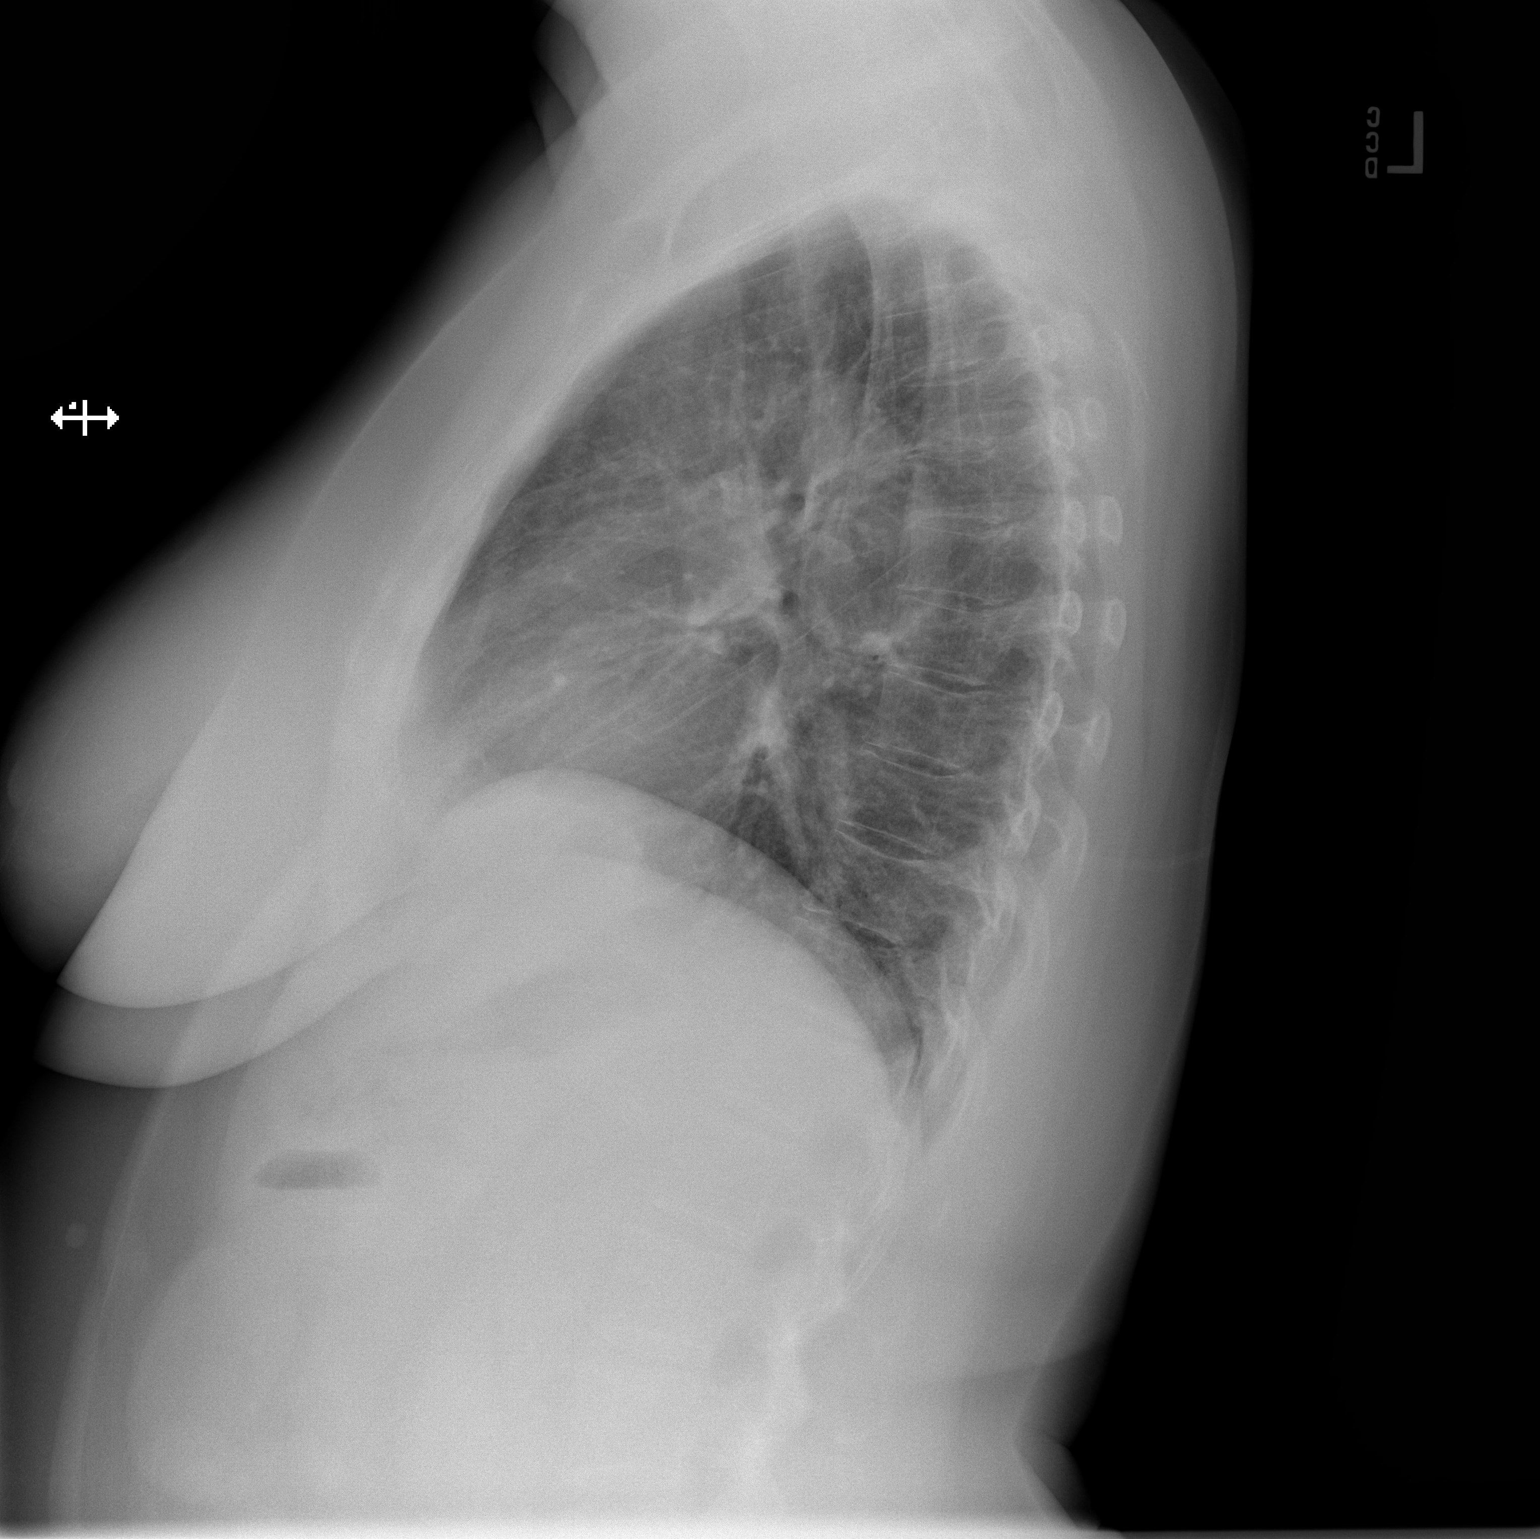

[2 of 2 positions shown; findings below may reference images not displayed]

FINDINGS: Cardiomediastinal silhouette unchanged in size and contour. No
evidence of central vascular congestion. No interlobular septal
thickening.

No pneumothorax or pleural effusion. Coarsened interstitial
markings, with no confluent airspace disease.

No acute displaced fracture. Degenerative changes of the spine.
IMPRESSION: Negative for acute cardiopulmonary disease
